# Patient Record
Sex: Male | Born: 1980 | Race: White | Hispanic: No | Marital: Married | Smoking: Never smoker
Health system: Southern US, Community
[De-identification: ages and names within clinical notes are randomized; demographics above are authoritative.]

## PROBLEM LIST (undated history)

## (undated) DIAGNOSIS — T148XXA Other injury of unspecified body region, initial encounter: Secondary | ICD-10-CM

## (undated) DIAGNOSIS — J45909 Unspecified asthma, uncomplicated: Secondary | ICD-10-CM

## (undated) DIAGNOSIS — S02109A Fracture of base of skull, unspecified side, initial encounter for closed fracture: Secondary | ICD-10-CM

## (undated) DIAGNOSIS — Z8669 Personal history of other diseases of the nervous system and sense organs: Secondary | ICD-10-CM

## (undated) HISTORY — DX: Other injury of unspecified body region, initial encounter: T14.8XXA

## (undated) HISTORY — DX: Unspecified asthma, uncomplicated: J45.909

## (undated) HISTORY — PX: MOUTH SURGERY: SHX715

## (undated) HISTORY — DX: Personal history of other diseases of the nervous system and sense organs: Z86.69

---
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
## 2007-02-17 ENCOUNTER — Ambulatory Visit: Payer: Self-pay | Admitting: Internal Medicine

## 2010-08-01 ENCOUNTER — Ambulatory Visit: Payer: Self-pay | Admitting: Orthopedic Surgery

## 2011-06-28 ENCOUNTER — Emergency Department (HOSPITAL_COMMUNITY)
Admission: EM | Admit: 2011-06-28 | Discharge: 2011-06-28 | Disposition: A | Discharge status: Home / Self Care | Payer: 59 | Attending: Emergency Medicine | Admitting: Emergency Medicine

## 2011-06-28 ENCOUNTER — Emergency Department (HOSPITAL_COMMUNITY): Payer: 59

## 2011-06-28 ENCOUNTER — Encounter (HOSPITAL_COMMUNITY): Payer: Self-pay | Admitting: Physical Medicine and Rehabilitation

## 2011-06-28 DIAGNOSIS — M79609 Pain in unspecified limb: Secondary | ICD-10-CM | POA: Insufficient documentation

## 2011-06-28 DIAGNOSIS — M542 Cervicalgia: Secondary | ICD-10-CM | POA: Insufficient documentation

## 2011-06-28 DIAGNOSIS — W19XXXA Unspecified fall, initial encounter: Secondary | ICD-10-CM

## 2011-06-28 DIAGNOSIS — M25579 Pain in unspecified ankle and joints of unspecified foot: Secondary | ICD-10-CM | POA: Insufficient documentation

## 2011-06-28 DIAGNOSIS — M79604 Pain in right leg: Secondary | ICD-10-CM

## 2011-06-28 DIAGNOSIS — R404 Transient alteration of awareness: Secondary | ICD-10-CM | POA: Insufficient documentation

## 2011-06-28 DIAGNOSIS — R109 Unspecified abdominal pain: Secondary | ICD-10-CM | POA: Insufficient documentation

## 2011-06-28 DIAGNOSIS — M549 Dorsalgia, unspecified: Secondary | ICD-10-CM | POA: Insufficient documentation

## 2011-06-28 HISTORY — DX: Fracture of base of skull, unspecified side, initial encounter for closed fracture: S02.109A

## 2011-06-28 MED ORDER — OXYCODONE HCL 5 MG PO TABS: 15.0000 mg | ORAL_TABLET | ORAL | Status: AC | PRN

## 2011-06-28 MED ORDER — OXYCODONE HCL 5 MG PO TABS
5.0000 mg | ORAL_TABLET | Freq: Once | ORAL | Status: AC
  Administered 2011-06-28: 5 mg via ORAL
  Filled 2011-06-28: qty 1

## 2011-06-28 NOTE — ED Notes (Signed)
Pt resting quietly at the time. Remains on cardiac monitor. States R sided rib cage and R leg "soreness." no signs of distress noted. Wife at bedside. Vital signs stable.

## 2011-06-28 NOTE — ED Notes (Signed)
Pt states 5/10 lower back pain at the time. Requesting pain medication. Vital signs stable. Family remains at bedside. No signs of distress noted.

## 2011-06-28 NOTE — ED Notes (Signed)
Pt discharged home, had no further questions. 
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
## 2011-06-28 NOTE — ED Notes (Addendum)
Pt ambulated around unit X 1 lap with no difficulties; pt reports being sore while walking but was able to walk independently; notified EDP

## 2015-02-25 DIAGNOSIS — Z8709 Personal history of other diseases of the respiratory system: Secondary | ICD-10-CM | POA: Insufficient documentation

## 2015-02-25 DIAGNOSIS — Z8669 Personal history of other diseases of the nervous system and sense organs: Secondary | ICD-10-CM | POA: Insufficient documentation

## 2015-02-26 ENCOUNTER — Encounter: Payer: Self-pay | Admitting: Family Medicine

## 2015-03-06 ENCOUNTER — Ambulatory Visit
Admission: RE | Admit: 2015-03-06 | Discharge: 2015-03-06 | Disposition: A | Discharge status: Home / Self Care | Payer: 59 | Source: Ambulatory Visit | Attending: Family Medicine | Admitting: Family Medicine

## 2015-03-06 ENCOUNTER — Encounter: Payer: Self-pay | Admitting: Family Medicine

## 2015-03-06 ENCOUNTER — Ambulatory Visit (INDEPENDENT_AMBULATORY_CARE_PROVIDER_SITE_OTHER): Payer: 59 | Admitting: Family Medicine

## 2015-03-06 VITALS — BP 120/70 | HR 77 | Temp 98.6°F | Resp 16 | Wt 140.0 lb

## 2015-03-06 DIAGNOSIS — Z0000 Encounter for general adult medical examination without abnormal findings: Secondary | ICD-10-CM

## 2015-03-06 DIAGNOSIS — Z23 Encounter for immunization: Secondary | ICD-10-CM

## 2015-03-06 DIAGNOSIS — M25531 Pain in right wrist: Secondary | ICD-10-CM | POA: Diagnosis not present
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
## 2015-03-07 ENCOUNTER — Telehealth: Payer: Self-pay | Admitting: *Deleted

## 2015-03-07 LAB — RENAL FUNCTION PANEL
ALBUMIN: 4.8 g/dL (ref 3.5–5.5)
BUN/Creatinine Ratio: 12 (ref 8–19)
BUN: 13 mg/dL (ref 6–20)
CHLORIDE: 95 mmol/L — AB (ref 97–106)
CO2: 27 mmol/L (ref 18–29)
Calcium: 9.9 mg/dL (ref 8.7–10.2)
Creatinine, Ser: 1.12 mg/dL (ref 0.76–1.27)
GFR calc Af Amer: 100 mL/min/{1.73_m2} (ref >59)
GFR, EST NON AFRICAN AMERICAN: 86 mL/min/{1.73_m2} (ref >59)
GLUCOSE: 75 mg/dL (ref 65–99)
PHOSPHORUS: 3.5 mg/dL (ref 2.5–4.5)
POTASSIUM: 4 mmol/L (ref 3.5–5.2)
Sodium: 140 mmol/L (ref 136–144)

## 2015-03-07 LAB — LIPID PANEL
CHOLESTEROL TOTAL: 98 mg/dL — AB (ref 100–199)
Chol/HDL Ratio: 2.5 ratio units (ref 0.0–5.0)
HDL: 39 mg/dL — AB (ref >39)
LDL Calculated: 47 mg/dL (ref 0–99)
Triglycerides: 60 mg/dL (ref 0–149)
VLDL Cholesterol Cal: 12 mg/dL (ref 5–40)

## 2015-03-07 MED ORDER — NABUMETONE 750 MG PO TABS: 750.0000 mg | ORAL_TABLET | Freq: Two times a day (BID) | ORAL | Status: DC

## 2015-03-07 NOTE — Telephone Encounter (Signed)
Rx sent in to pharmacy. Patient notified.

## 2015-03-07 NOTE — Telephone Encounter (Signed)
He can try nabumetone 750mg  two tablets daily for 14 days.

## 2015-03-07 NOTE — Telephone Encounter (Signed)
Patient stated that he has taken naproxen before and he said it gave him "bad dreams". patietn wanted to no it there is something else he can take instead?

## 2015-03-07 NOTE — Telephone Encounter (Signed)
-----   Message from Malva Limesonald E Fisher, MD sent at 03/06/2015  5:28 PM EDT ----- Xray wrist is normal. No sign of fracture. He likely has sprain and should start rx for naprosyn 500mg  twice a day for 14 days.

## 2015-10-04 ENCOUNTER — Ambulatory Visit (INDEPENDENT_AMBULATORY_CARE_PROVIDER_SITE_OTHER): Payer: 59 | Admitting: Family Medicine

## 2015-10-04 ENCOUNTER — Encounter: Payer: Self-pay | Admitting: Family Medicine

## 2015-10-04 ENCOUNTER — Other Ambulatory Visit: Payer: 59

## 2015-10-04 VITALS — BP 120/80 | HR 83 | Temp 98.1°F | Resp 16 | Wt 146.0 lb

## 2015-10-04 DIAGNOSIS — Z9852 Vasectomy status: Secondary | ICD-10-CM

## 2015-10-04 DIAGNOSIS — Z809 Family history of malignant neoplasm, unspecified: Secondary | ICD-10-CM

## 2015-10-04 NOTE — Progress Notes (Signed)
       Patient: Timothy Crosby Male    DOB: 1981-03-08   35 y.o.   MRN: [id number] Visit Date: 10/04/2015  Today's Provider: Mila Merryonald Fisher, MD   Chief Complaint  Patient presents with  . Referral   Subjective:    HPI  Patient wants to discuss having a genetic cancer screening. His states his grandfather had prostate and bladder cancer, and uncle developed bladder cancer in his 4140s. His father developed throat cancer in his 6950s, and is a non-smoker and a non-drinker. His mother developed breast cancer in her 30s.     Allergies  Allergen Reactions  . Divalproex Sodium Other (See Comments)    unknown  . Primaquine Other (See Comments)    unknown  . Primaquine Phosphate    Previous Medications   NABUMETONE (RELAFEN) 750 MG TABLET    Take 1 tablet (750 mg total) by mouth 2 (two) times daily.    Review of Systems  Constitutional: Negative for fever, chills and appetite change.  Respiratory: Negative for chest tightness, shortness of breath and wheezing.   Cardiovascular: Negative for chest pain and palpitations.  Gastrointestinal: Negative for nausea, vomiting and abdominal pain.    Social History  Substance Use Topics  . Smoking status: Never Smoker   . Smokeless tobacco: Former NeurosurgeonUser    Types: Chew    Quit date: 07/10/2012     Comment: previously chewed 1 can daily for 18 years  . Alcohol Use: No   Objective:   BP 120/80 mmHg  Pulse 83  Temp(Src) 98.1 F (36.7 C) (Oral)  Resp 16  Wt 146 lb (66.225 kg)  SpO2 95%  Physical Exam  n/a    Assessment & Plan:     1. Family history of cancer Counseled patient on importance of recommended cancer screenings. Genetic counseling is still somewhat limited. His wife wants him to have genetic screening. Recommend he setup evaluation with genetic counselor. Will get back in touch with patient regarding genetic counseling available in the area.        Mila Merryonald Fisher, MD  [facility]

## 2015-10-05 LAB — POST-VAS SPERM EVALUATION,QUAL
Post-Vas Sperm, Qual: ABSENT
SEMEN VOLUME: 3.6 mL

## 2015-10-06 ENCOUNTER — Telehealth: Payer: Self-pay | Admitting: Family Medicine

## 2015-10-06 NOTE — Telephone Encounter (Signed)
Please advise patient that he should contact [facility] for consultation regarding genetic cancer screening. No referral is needed. He can call [phone] or Google [facility].

## 2015-10-07 ENCOUNTER — Telehealth: Payer: Self-pay

## 2015-10-07 ENCOUNTER — Telehealth: Payer: Self-pay | Admitting: Urology

## 2015-10-07 NOTE — Telephone Encounter (Signed)
Advised patient as below.  

## 2015-10-07 NOTE — Telephone Encounter (Signed)
Spoke with pt in reference to vas sample. Pt voiced understanding. Vas was performed 06/2014.

## 2015-10-07 NOTE — Telephone Encounter (Signed)
-----   Message from Vanna ScotlandAshley Brandon, MD sent at 10/07/2015 10:57 AM EDT ----- No sperm.  Did we do this over a year ago?  I see no notes.  Vanna ScotlandAshley Brandon, MD

## 2015-10-07 NOTE — Telephone Encounter (Signed)
Pt was wondering the results of his semen sample the dropped off the other day. Please advise

## 2016-04-15 ENCOUNTER — Ambulatory Visit: Payer: 59

## 2016-04-15 ENCOUNTER — Ambulatory Visit (INDEPENDENT_AMBULATORY_CARE_PROVIDER_SITE_OTHER): Payer: 59 | Admitting: Family Medicine

## 2016-04-15 DIAGNOSIS — Z23 Encounter for immunization: Secondary | ICD-10-CM | POA: Diagnosis not present

## 2016-09-14 DIAGNOSIS — Z0000 Encounter for general adult medical examination without abnormal findings: Secondary | ICD-10-CM | POA: Diagnosis not present

## 2016-09-14 DIAGNOSIS — K219 Gastro-esophageal reflux disease without esophagitis: Secondary | ICD-10-CM | POA: Diagnosis not present

## 2016-09-14 DIAGNOSIS — K580 Irritable bowel syndrome with diarrhea: Secondary | ICD-10-CM | POA: Diagnosis not present

## 2016-09-14 DIAGNOSIS — F5101 Primary insomnia: Secondary | ICD-10-CM | POA: Diagnosis not present

## 2016-09-25 DIAGNOSIS — R7989 Other specified abnormal findings of blood chemistry: Secondary | ICD-10-CM | POA: Diagnosis not present

## 2017-03-08 ENCOUNTER — Ambulatory Visit (INDEPENDENT_AMBULATORY_CARE_PROVIDER_SITE_OTHER): Payer: 59 | Admitting: Family Medicine

## 2017-03-08 ENCOUNTER — Encounter: Payer: Self-pay | Admitting: *Deleted

## 2017-03-08 ENCOUNTER — Encounter: Payer: Self-pay | Admitting: Family Medicine

## 2017-03-08 VITALS — BP 104/68 | HR 71 | Temp 98.5°F | Resp 16 | Wt 151.0 lb

## 2017-03-08 DIAGNOSIS — S39012A Strain of muscle, fascia and tendon of lower back, initial encounter: Secondary | ICD-10-CM | POA: Diagnosis not present

## 2017-03-08 LAB — POCT URINALYSIS DIPSTICK
Bilirubin, UA: NEGATIVE
Blood, UA: NEGATIVE
Glucose, UA: NEGATIVE
KETONES UA: NEGATIVE
LEUKOCYTES UA: NEGATIVE
NITRITE UA: NEGATIVE
PROTEIN UA: NEGATIVE
Spec Grav, UA: <=1.005 — AB (ref 1.010–1.025)
UROBILINOGEN UA: 0.2 U/dL
pH, UA: 6.5 (ref 5.0–8.0)

## 2017-03-08 NOTE — Patient Instructions (Signed)
Low Back Sprain  A sprain is a stretch or tear in the bands of tissue that hold bones and joints together (ligaments). Sprains of the lower back (lumbar spine) are a common cause of low back pain. A sprain occurs when ligaments are overextended or stretched beyond their limits. The ligaments can become inflamed, resulting in pain and sudden muscle tightening (spasms). A sprain can be caused by an injury (trauma), or it can develop gradually due to overuse.  There are three types of sprains:  · Grade 1 is a mild sprain involving an overstretched ligament or a very slight tear of the ligament.  · Grade 2 is a moderate sprain involving a partial tear of the ligament.  · Grade 3 is a severe sprain involving a complete tear of the ligament.    What are the causes?  This condition may be caused by:  · Trauma, such as a fall or a hit to the body.  · Twisting or overstretching the back. This may result from doing activities that require a lot of energy, such as lifting heavy objects.    What increases the risk?  The following factors may increase your risk of getting this condition:  · Playing contact sports.  · Participating in sports or activities that put excessive stress on the back and require a lot of bending and twisting, including:  ? Lifting weights or heavy objects.  ? Gymnastics.  ? Soccer.  ? Figure skating.  ? Snowboarding.  · Being overweight or obese.  · Having poor strength and flexibility.    What are the signs or symptoms?  Symptoms of this condition may include:  · Sharp or dull pain in the lower back that does not go away. Pain may extend to the buttocks.  · Stiffness.  · Limited range of motion.  · Inability to stand up straight due to stiffness or pain.  · Muscle spasms.    How is this diagnosed?    This condition may be diagnosed based on:  · Your symptoms.  · Your medical history.  · A physical exam.  ? Your health care provider may push on certain areas of your back to determine the source of your  pain.  ? You may be asked to bend forward, backward, and side to side to assess the severity of your pain and your range of motion.  · Imaging tests, such as:  ? X-rays.  ? MRI.    How is this treated?  Treatment for this condition may include:  · Applying heat and cold to the affected area.  · Medicines to help relieve pain and to relax your muscles (muscle relaxants).  · NSAIDs to help reduce swelling and discomfort.  · Physical therapy.    When your symptoms improve, it is important to gradually return to your normal routine as soon as possible to reduce pain, avoid stiffness, and avoid loss of muscle strength. Generally, symptoms should improve within 6 weeks of treatment. However, recovery time varies.  Follow these instructions at home:  Managing pain, stiffness, and swelling  · If directed, apply ice to the injured area during the first 24 hours after your injury.  ? Put ice in a plastic bag.  ? Place a towel between your skin and the bag.  ? Leave the ice on for 20 minutes, 2-3 times a day.  · If directed, apply heat to the affected area as often as told by your health care provider. Use the   heat source that your health care provider recommends, such as a moist heat pack or a heating pad.  ? Place a towel between your skin and the heat source.  ? Leave the heat on for 20-30 minutes.  ? Remove the heat if your skin turns bright red. This is especially important if you are unable to feel pain, heat, or cold. You may have a greater risk of getting burned.  Activity  · Rest and return to your normal activities as told by your health care provider. Ask your health care provider what activities are safe for you.  · Avoid activities that take a lot of effort (are strenuous) for as long as told by your health care provider.  · Do exercises as told by your health care provider.  General instructions    · Take over-the-counter and prescription medicines only as told by your health care provider.  · If you have  questions or concerns about safety while taking pain medicine, talk with your health care provider.  · Do not drive or operate heavy machinery until you know how your pain medicine affects you.  · Do not use any tobacco products, such as cigarettes, chewing tobacco, and e-cigarettes. Tobacco can delay bone healing. If you need help quitting, ask your health care provider.  · Keep all follow-up visits as told by your health care provider. This is important.  How is this prevented?  · Warm up and stretch before being active.  · Cool down and stretch after being active.  · Give your body time to rest between periods of activity.  · Avoid:  ? Being physically inactive for long periods at a time.  ? Exercising or playing sports when you are tired or in pain.  · Use correct form when playing sports and lifting heavy objects.  · Use good posture when sitting and standing.  · Maintain a healthy weight.  · Sleep on a mattress with medium firmness to support your back.  · Make sure to use equipment that fits you, including shoes that fit well.  · Be safe and responsible while being active to avoid falls.  · Do at least 150 minutes of moderate-intensity exercise each week, such as brisk walking or water aerobics. Try a form of exercise that takes stress off your back, such as swimming or stationary cycling.  · Maintain physical fitness, including:  ? Strength. In particular, develop and maintain strong abdominal muscles.  ? Flexibility.  ? Cardiovascular fitness.  ? Endurance.  Contact a health care provider if:  · Your back pain does not improve after 6 weeks of treatment.  · Your symptoms get worse.  Get help right away if:  · Your back pain is severe.  · You are unable to stand or walk.  · You develop pain in your legs.  · You develop weakness in your buttocks or legs.  · You have difficulty controlling when you urinate or when you have a bowel movement.  This information is not intended to replace advice given to you by  your health care provider. Make sure you discuss any questions you have with your health care provider.  Document Released: 05/04/2005 Document Revised: 01/09/2016 Document Reviewed: 02/13/2015  Elsevier Interactive Patient Education © 2018 [employer]

## 2017-03-08 NOTE — Progress Notes (Signed)
Patient: Timothy Crosby Male    DOB: [birth year]   36 y.o.   MRN: [id number] Visit Date: 03/08/2017  Today's Provider: Mila Merry, MD   Chief Complaint  Patient presents with  . Back Pain   Subjective:    Back Pain  This is a new problem. Episode onset: 1 week ago. The problem has been gradually worsening since onset. Quality: sharp pain in mid-lower back. The symptoms are aggravated by bending (picking up a small child). Pertinent negatives include no abdominal pain, bladder incontinence, bowel incontinence, chest pain, dysuria, fever, headaches, leg pain, numbness, paresis, tingling, weakness or weight loss. He has tried NSAIDs, heat and ice for the symptoms. The treatment provided mild relief.  Patient states 1 week ago he has urinary hesitancy.    Yesterday was picking up his daughter. Took 800mg  ibuprofen last night and this morning with mild rrelief.  Unable to work today.     Allergies  Allergen Reactions  . Divalproex Sodium Other (See Comments)    unknown  . Primaquine Other (See Comments)    unknown  . Primaquine Phosphate      Current Outpatient Prescriptions:  .  ibuprofen (ADVIL,MOTRIN) 800 MG tablet, Take 800 mg by mouth every 8 (eight) hours as needed., Disp: , Rfl:   Review of Systems  Constitutional: Negative for fever and weight loss.  Cardiovascular: Negative for chest pain.  Gastrointestinal: Negative for abdominal pain and bowel incontinence.  Genitourinary: Negative for bladder incontinence and dysuria.       Urinary hesitancy  Musculoskeletal: Positive for back pain.  Neurological: Negative for tingling, weakness, numbness and headaches.    Social History  Substance Use Topics  . Smoking status: Never Smoker  . 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 Alcohol use No   Objective:   BP 104/68 (BP Location: Right Arm, Patient Position: Sitting, Cuff  Size: Normal)   Pulse 71   Temp 98.5 F (36.9 C) (Oral)   Resp 16   Wt 151 lb (68.5 kg)   SpO2 97% Comment: room  air Vitals:   03/08/17 1454  BP: 104/68  Pulse: 71  Resp: 16  Temp: 98.5 F (36.9 C)  TempSrc: Oral  SpO2: 97%  Weight: 151 lb (68.5 kg)     Physical Exam  General appearance: alert, well developed, well nourished, cooperative and in no distress Head: Normocephalic, without obvious abnormality, atraumatic Respiratory: Respirations even and unlabored, normal respiratory rate MS: moderate tenderness over upper lumbar spine. No swelling or gross deformities. No para lumbar muscle tenderness. +5 LE Ms strength.    Results for orders placed or performed in visit on 03/08/17  POCT Urinalysis Dipstick  Result Value Ref Range   Color, UA yellow    Clarity, UA clear    Glucose, UA negative    Bilirubin, UA negative    Ketones, UA negative    Spec Grav, UA <=1.005 (A) 1.010 - 1.025   Blood, UA negative    pH, UA 6.5 5.0 - 8.0   Protein, UA negative    Urobilinogen, UA 0.2 0.2 or 1.0 E.U./dL   Nitrite, UA negative    Leukocytes, UA Negative Negative       Assessment & Plan:     1.  Strain of lumbar region, initial encounter Continue applying ice 3-4 times a day  for next 24 hours, then switch to heat. Continue ibuprofen up to 800 mg three times a day.  Call if symptoms change or if not rapidly improving.  Work excuse yesterday, today, and tomorrow.   - POCT Urinalysis Dipstick       Mila Merryonald Fisher, MD  [facility]

## 2017-05-26 IMAGING — CR DG WRIST COMPLETE 3+V*R*
1 series · 4 of 4 positions shown · non-contrast
Comparison: None.

CLINICAL DATA: Right wrist pain, no known injury

EXAM:
RIGHT WRIST - COMPLETE 3+ VIEW

[Series 1: dg wrist complete right · 0.14mm/px · 4 of 4 slices shown]
[im 1/4]
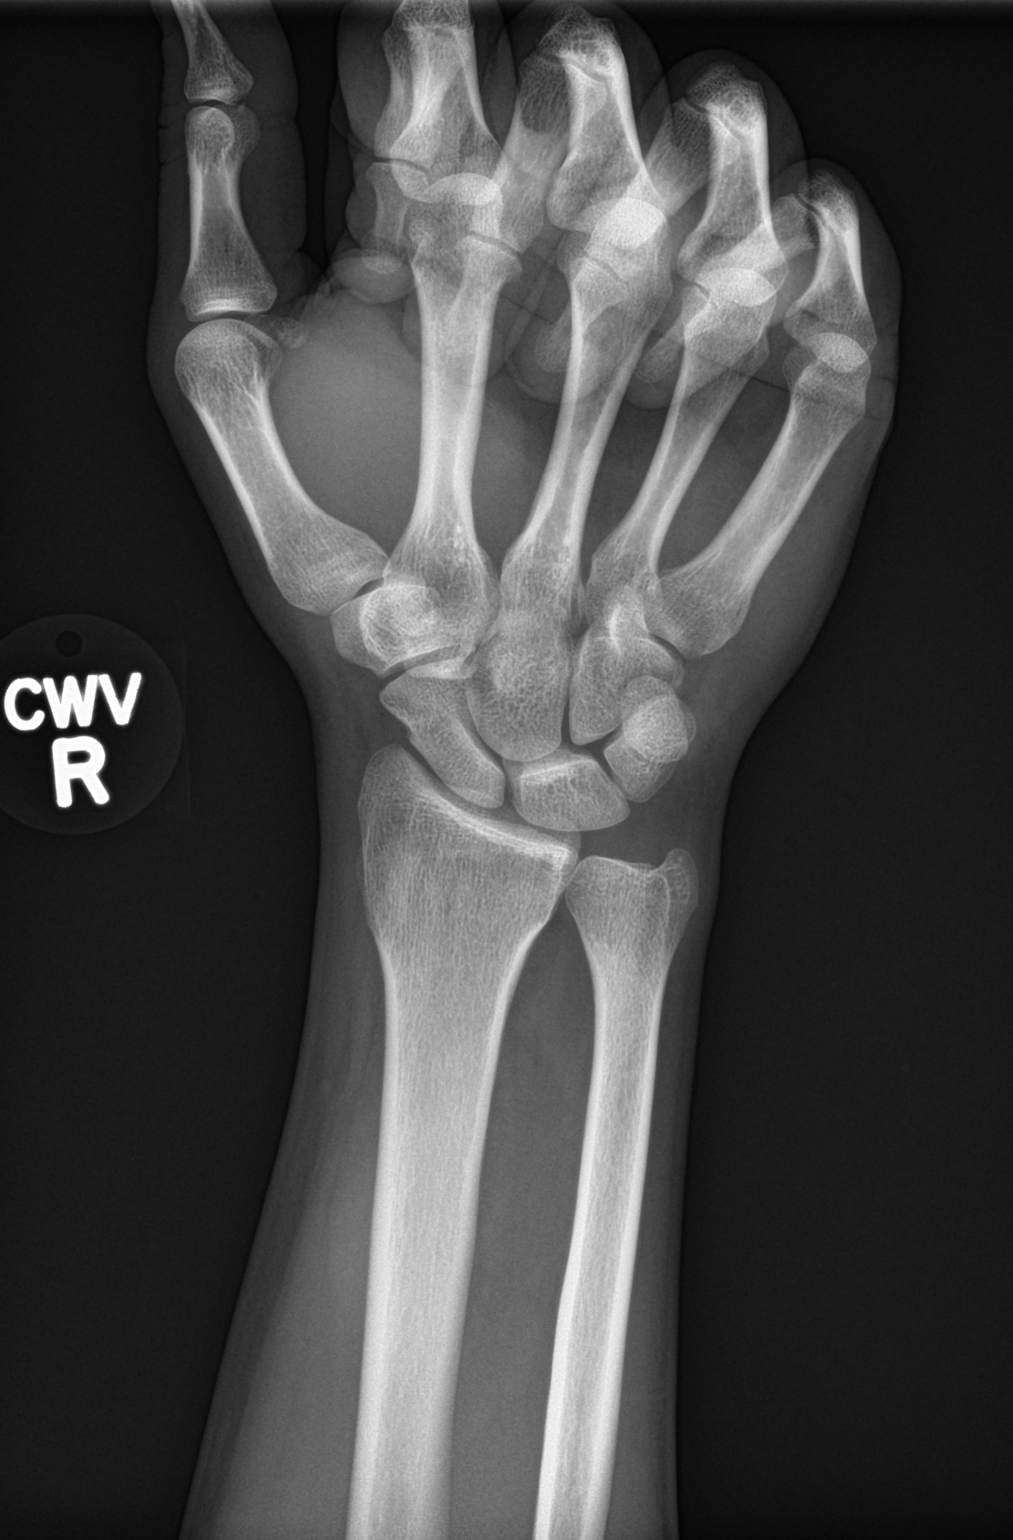
[im 2/4]
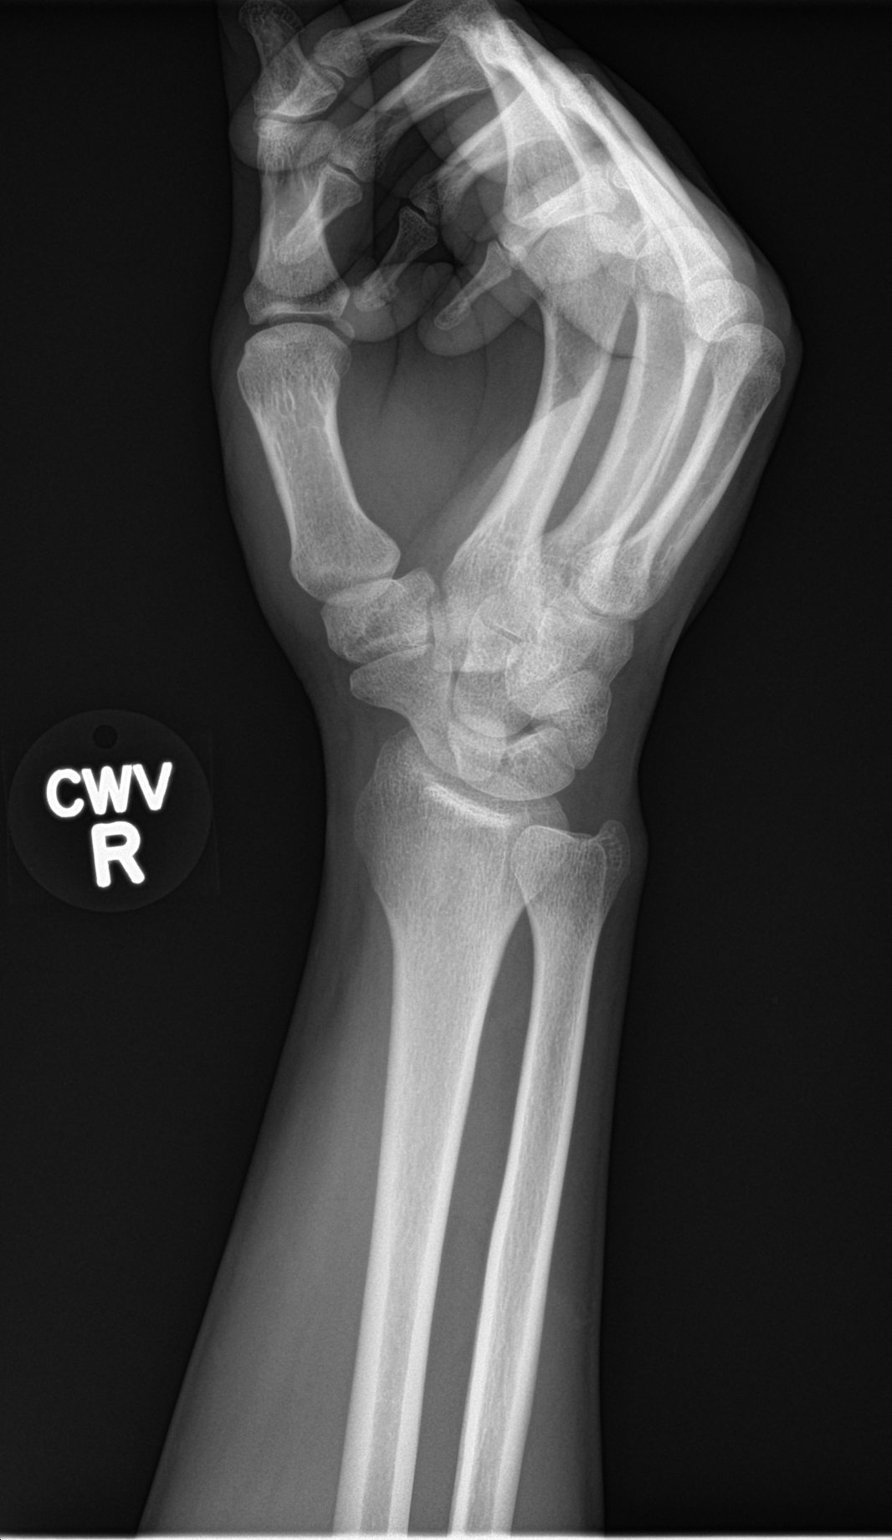
[im 3/4]
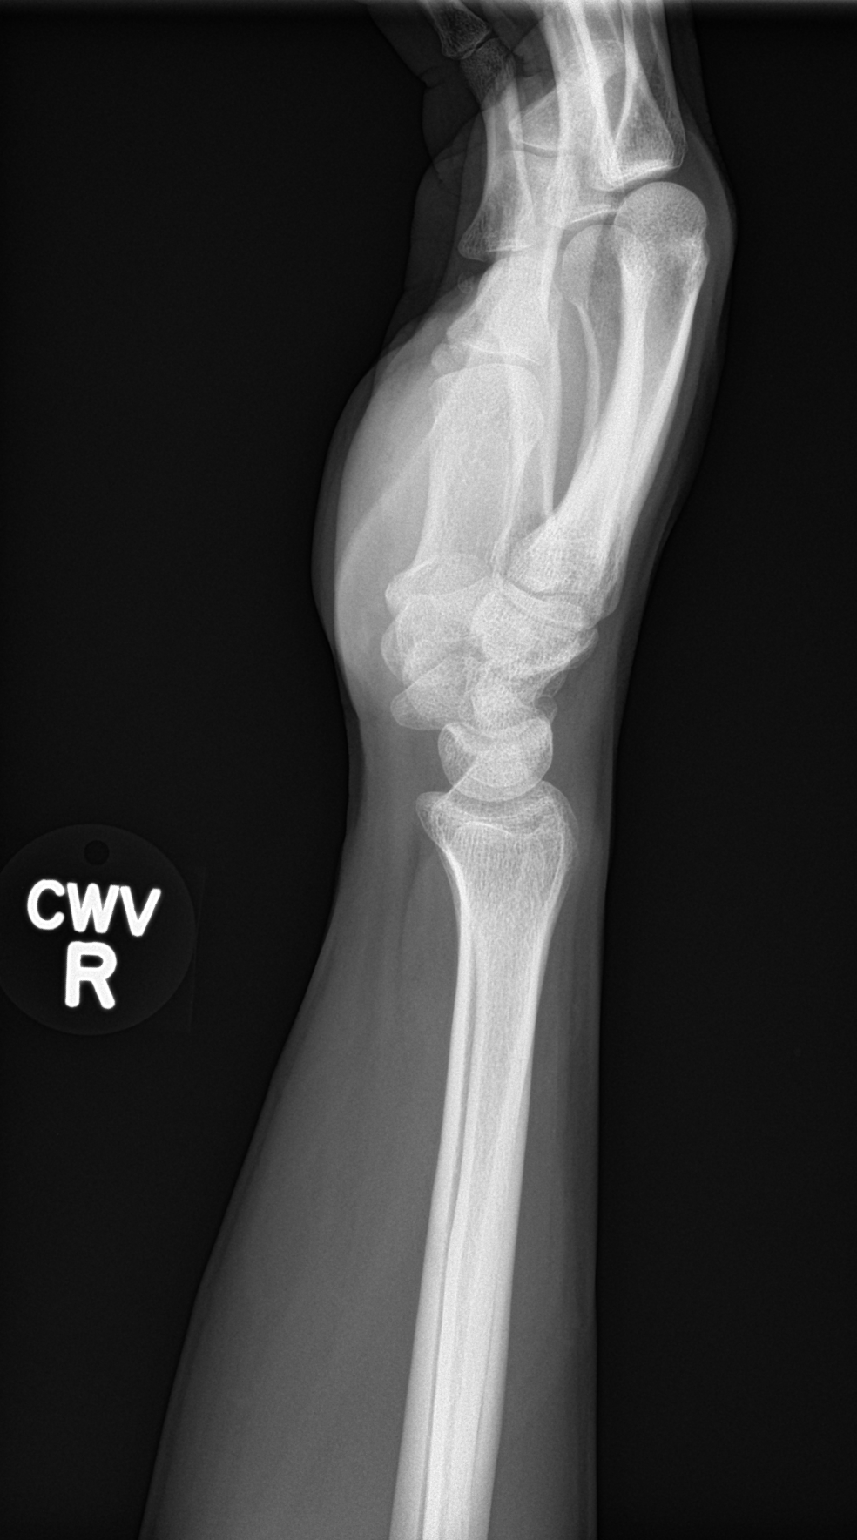
[im 4/4]
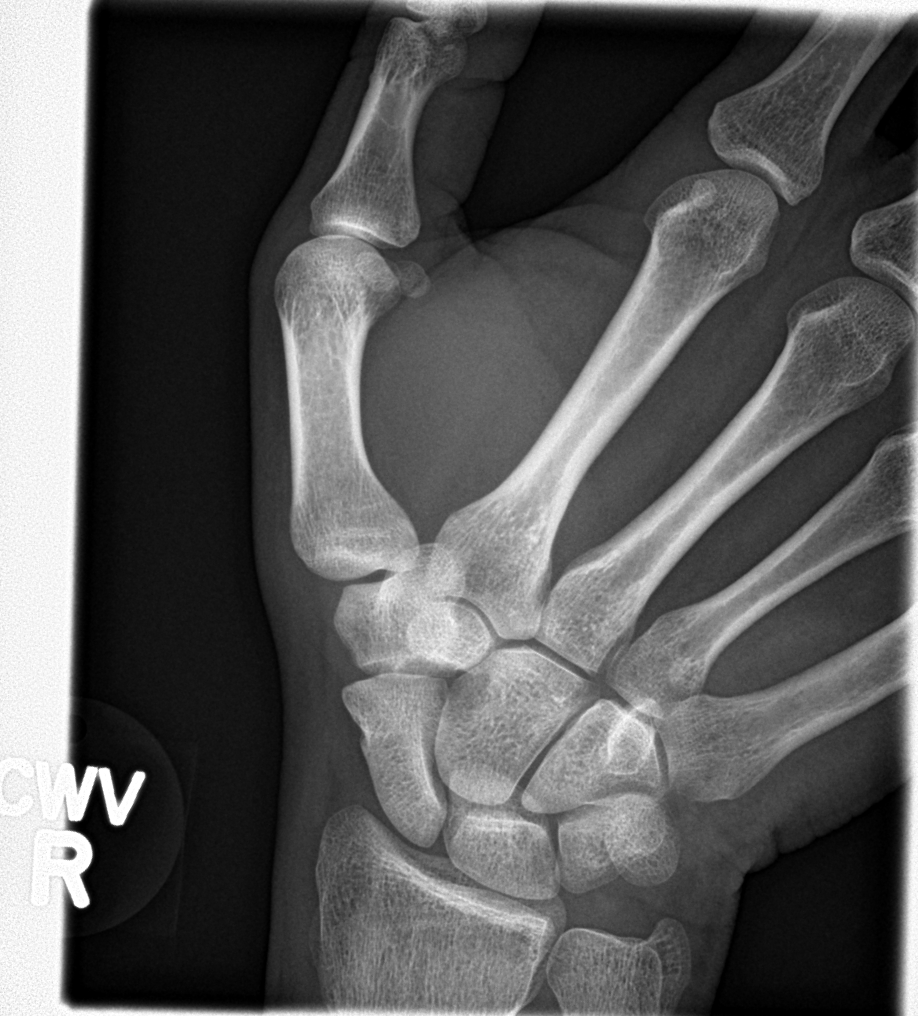

[4 of 4 positions shown; findings below may reference images not displayed]

FINDINGS: The right radiocarpal joint space appears normal. The ulnar styloid
is intact. The carpal bones are in normal position. Alignment is
normal. No fracture is seen. No soft tissue calcification is noted.
IMPRESSION: Negative.

## 2017-08-02 DIAGNOSIS — M9902 Segmental and somatic dysfunction of thoracic region: Secondary | ICD-10-CM | POA: Diagnosis not present

## 2017-08-02 DIAGNOSIS — M546 Pain in thoracic spine: Secondary | ICD-10-CM | POA: Diagnosis not present

## 2017-08-02 DIAGNOSIS — M9901 Segmental and somatic dysfunction of cervical region: Secondary | ICD-10-CM | POA: Diagnosis not present

## 2022-01-16 ENCOUNTER — Telehealth: Payer: Self-pay

## 2022-01-16 NOTE — Telephone Encounter (Signed)
Copied from CRM [id number]. Topic: Medical Record Request - Other >> Jan 16, 2022  3:36 PM De Blanch wrote: Reason for CRM: Pt stated he filed a claim with [employer]. Pt is asking if he needs to come into the office to sign release forms for medical records to be released when they make the request or if this can be emailed to him.  Please advise.

## 2022-02-02 ENCOUNTER — Encounter (HOSPITAL_COMMUNITY)
Admission: EM | Disposition: A | Discharge status: Home / Self Care | Payer: Self-pay | Source: Home / Self Care | Attending: Emergency Medicine

## 2022-02-02 ENCOUNTER — Emergency Department (HOSPITAL_COMMUNITY): Payer: 59

## 2022-02-02 ENCOUNTER — Encounter (HOSPITAL_COMMUNITY): Payer: Self-pay

## 2022-02-02 ENCOUNTER — Other Ambulatory Visit: Payer: Self-pay

## 2022-02-02 ENCOUNTER — Ambulatory Visit (HOSPITAL_COMMUNITY)
Admission: EM | Admit: 2022-02-02 | Discharge: 2022-02-02 | Disposition: A | Discharge status: Home / Self Care | Payer: 59 | Attending: Surgery | Admitting: Surgery

## 2022-02-02 ENCOUNTER — Emergency Department (HOSPITAL_BASED_OUTPATIENT_CLINIC_OR_DEPARTMENT_OTHER): Payer: 59 | Admitting: Anesthesiology

## 2022-02-02 ENCOUNTER — Emergency Department (HOSPITAL_COMMUNITY): Payer: 59 | Admitting: Anesthesiology

## 2022-02-02 DIAGNOSIS — K8012 Calculus of gallbladder with acute and chronic cholecystitis without obstruction: Secondary | ICD-10-CM | POA: Insufficient documentation

## 2022-02-02 DIAGNOSIS — K828 Other specified diseases of gallbladder: Secondary | ICD-10-CM | POA: Diagnosis not present

## 2022-02-02 DIAGNOSIS — R109 Unspecified abdominal pain: Secondary | ICD-10-CM | POA: Diagnosis present

## 2022-02-02 DIAGNOSIS — K8000 Calculus of gallbladder with acute cholecystitis without obstruction: Secondary | ICD-10-CM

## 2022-02-02 DIAGNOSIS — G40909 Epilepsy, unspecified, not intractable, without status epilepticus: Secondary | ICD-10-CM | POA: Diagnosis not present

## 2022-02-02 HISTORY — PX: CHOLECYSTECTOMY: SHX55

## 2022-02-02 LAB — LIPASE, BLOOD: Lipase: 42 U/L (ref 11–51)

## 2022-02-02 LAB — URINALYSIS, ROUTINE W REFLEX MICROSCOPIC
Bilirubin Urine: NEGATIVE
Glucose, UA: NEGATIVE mg/dL
Hgb urine dipstick: NEGATIVE
Ketones, ur: NEGATIVE mg/dL
Leukocytes,Ua: NEGATIVE
Nitrite: NEGATIVE
Protein, ur: NEGATIVE mg/dL
Specific Gravity, Urine: 1.03 (ref 1.005–1.030)
pH: 5 (ref 5.0–8.0)

## 2022-02-02 LAB — CBC WITH DIFFERENTIAL/PLATELET
Abs Immature Granulocytes: 0.03 10*3/uL (ref 0.00–0.07)
Basophils Absolute: 0 10*3/uL (ref 0.0–0.1)
Basophils Relative: 0 %
Eosinophils Absolute: 0 10*3/uL (ref 0.0–0.5)
Eosinophils Relative: 0 %
HCT: 43.9 % (ref 39.0–52.0)
Hemoglobin: 15.1 g/dL (ref 13.0–17.0)
Immature Granulocytes: 0 %
Lymphocytes Relative: 12 %
Lymphs Abs: 1.2 10*3/uL (ref 0.7–4.0)
MCH: 28.9 pg (ref 26.0–34.0)
MCHC: 34.4 g/dL (ref 30.0–36.0)
MCV: 84.1 fL (ref 80.0–100.0)
Monocytes Absolute: 0.5 10*3/uL (ref 0.1–1.0)
Monocytes Relative: 5 %
Neutro Abs: 8.1 10*3/uL — ABNORMAL HIGH (ref 1.7–7.7)
Neutrophils Relative %: 83 %
Platelets: 273 10*3/uL (ref 150–400)
RBC: 5.22 MIL/uL (ref 4.22–5.81)
RDW: 12 % (ref 11.5–15.5)
WBC: 9.8 10*3/uL (ref 4.0–10.5)
nRBC: 0 % (ref 0.0–0.2)

## 2022-02-02 LAB — COMPREHENSIVE METABOLIC PANEL
ALT: 18 U/L (ref 0–44)
AST: 21 U/L (ref 15–41)
Albumin: 4.6 g/dL (ref 3.5–5.0)
Alkaline Phosphatase: 56 U/L (ref 38–126)
Anion gap: 7 (ref 5–15)
BUN: 13 mg/dL (ref 6–20)
CO2: 28 mmol/L (ref 22–32)
Calcium: 9.7 mg/dL (ref 8.9–10.3)
Chloride: 105 mmol/L (ref 98–111)
Creatinine, Ser: 1.32 mg/dL — ABNORMAL HIGH (ref 0.61–1.24)
GFR, Estimated: >60 mL/min (ref >60)
Glucose, Bld: 123 mg/dL — ABNORMAL HIGH (ref 70–99)
Potassium: 4.1 mmol/L (ref 3.5–5.1)
Sodium: 140 mmol/L (ref 135–145)
Total Bilirubin: 0.7 mg/dL (ref 0.3–1.2)
Total Protein: 7.1 g/dL (ref 6.5–8.1)

## 2022-02-02 SURGERY — LAPAROSCOPIC CHOLECYSTECTOMY
Anesthesia: General | Site: Abdomen

## 2022-02-02 MED ORDER — BUPIVACAINE-EPINEPHRINE (PF) 0.25% -1:200000 IJ SOLN
INTRAMUSCULAR | Status: AC
  Filled 2022-02-02: qty 30

## 2022-02-02 MED ORDER — FENTANYL CITRATE (PF) 250 MCG/5ML IJ SOLN
INTRAMUSCULAR | Status: AC
  Filled 2022-02-02: qty 5

## 2022-02-02 MED ORDER — PROPOFOL 10 MG/ML IV BOLUS
INTRAVENOUS | Status: DC | PRN
  Administered 2022-02-02: 120 mg via INTRAVENOUS

## 2022-02-02 MED ORDER — OXYCODONE HCL 5 MG PO TABS
5.0000 mg | ORAL_TABLET | Freq: Once | ORAL | Status: AC
  Administered 2022-02-02: 5 mg via ORAL

## 2022-02-02 MED ORDER — DEXAMETHASONE SODIUM PHOSPHATE 10 MG/ML IJ SOLN
INTRAMUSCULAR | Status: DC | PRN
  Administered 2022-02-02: 8 mg via INTRAVENOUS

## 2022-02-02 MED ORDER — LACTATED RINGERS IV SOLN: INTRAVENOUS | Status: DC

## 2022-02-02 MED ORDER — BUPIVACAINE-EPINEPHRINE 0.25% -1:200000 IJ SOLN
INTRAMUSCULAR | Status: DC | PRN
  Administered 2022-02-02: 20 mL

## 2022-02-02 MED ORDER — 0.9 % SODIUM CHLORIDE (POUR BTL) OPTIME
TOPICAL | Status: DC | PRN
  Administered 2022-02-02: 1000 mL

## 2022-02-02 MED ORDER — SODIUM CHLORIDE 0.9 % IV SOLN
2.0000 g | Freq: Once | INTRAVENOUS | Status: AC
  Administered 2022-02-02: 2 g via INTRAVENOUS
  Filled 2022-02-02: qty 20

## 2022-02-02 MED ORDER — SODIUM CHLORIDE 0.9 % IR SOLN
Status: DC | PRN
  Administered 2022-02-02: 1000 mL

## 2022-02-02 MED ORDER — IOHEXOL 9 MG/ML PO SOLN
ORAL | Status: AC
  Filled 2022-02-02: qty 1000

## 2022-02-02 MED ORDER — SUGAMMADEX SODIUM 200 MG/2ML IV SOLN
INTRAVENOUS | Status: DC | PRN
  Administered 2022-02-02: 200 mg via INTRAVENOUS

## 2022-02-02 MED ORDER — LIDOCAINE 2% (20 MG/ML) 5 ML SYRINGE
INTRAMUSCULAR | Status: DC | PRN
  Administered 2022-02-02: 40 mg via INTRAVENOUS

## 2022-02-02 MED ORDER — MIDAZOLAM HCL 2 MG/2ML IJ SOLN
INTRAMUSCULAR | Status: DC | PRN
  Administered 2022-02-02: 2 mg via INTRAVENOUS

## 2022-02-02 MED ORDER — MIDAZOLAM HCL 2 MG/2ML IJ SOLN
INTRAMUSCULAR | Status: AC
  Filled 2022-02-02: qty 2

## 2022-02-02 MED ORDER — ORAL CARE MOUTH RINSE: 15.0000 mL | Freq: Once | OROMUCOSAL | Status: AC

## 2022-02-02 MED ORDER — PROPOFOL 10 MG/ML IV BOLUS
INTRAVENOUS | Status: AC
  Filled 2022-02-02: qty 20

## 2022-02-02 MED ORDER — FENTANYL CITRATE (PF) 100 MCG/2ML IJ SOLN
INTRAMUSCULAR | Status: AC
  Filled 2022-02-02: qty 2

## 2022-02-02 MED ORDER — DICYCLOMINE HCL 10 MG/ML IM SOLN: 20.0000 mg | Freq: Once | INTRAMUSCULAR | Status: DC

## 2022-02-02 MED ORDER — ONDANSETRON HCL 4 MG/2ML IJ SOLN
INTRAMUSCULAR | Status: DC | PRN
  Administered 2022-02-02: 4 mg via INTRAVENOUS

## 2022-02-02 MED ORDER — ACETAMINOPHEN 500 MG PO TABS
1000.0000 mg | ORAL_TABLET | Freq: Once | ORAL | Status: AC
  Administered 2022-02-02: 1000 mg via ORAL
  Filled 2022-02-02: qty 2

## 2022-02-02 MED ORDER — OXYCODONE HCL 5 MG PO TABS: 5.0000 mg | ORAL_TABLET | Freq: Four times a day (QID) | ORAL | 0 refills | Status: DC | PRN

## 2022-02-02 MED ORDER — CHLORHEXIDINE GLUCONATE 0.12 % MT SOLN: 15.0000 mL | Freq: Once | OROMUCOSAL | Status: AC

## 2022-02-02 MED ORDER — IBUPROFEN 200 MG PO TABS: 600.0000 mg | ORAL_TABLET | Freq: Three times a day (TID) | ORAL | 0 refills | Status: AC | PRN

## 2022-02-02 MED ORDER — SODIUM CHLORIDE 0.9 % IV BOLUS
1000.0000 mL | Freq: Once | INTRAVENOUS | Status: AC
  Administered 2022-02-02: 1000 mL via INTRAVENOUS

## 2022-02-02 MED ORDER — AMISULPRIDE (ANTIEMETIC) 5 MG/2ML IV SOLN: 10.0000 mg | Freq: Once | INTRAVENOUS | Status: DC | PRN

## 2022-02-02 MED ORDER — HYDROCODONE-ACETAMINOPHEN 5-325 MG PO TABS
1.0000 | ORAL_TABLET | Freq: Once | ORAL | Status: AC
  Administered 2022-02-02: 1 via ORAL
  Filled 2022-02-02: qty 1

## 2022-02-02 MED ORDER — FENTANYL CITRATE (PF) 250 MCG/5ML IJ SOLN
INTRAMUSCULAR | Status: DC | PRN
  Administered 2022-02-02 (×4): 50 ug via INTRAVENOUS

## 2022-02-02 MED ORDER — IOHEXOL 350 MG/ML SOLN
75.0000 mL | Freq: Once | INTRAVENOUS | Status: AC | PRN
  Administered 2022-02-02: 75 mL via INTRAVENOUS

## 2022-02-02 MED ORDER — OXYCODONE HCL 5 MG PO TABS
ORAL_TABLET | ORAL | Status: AC
  Filled 2022-02-02: qty 1

## 2022-02-02 MED ORDER — ROCURONIUM BROMIDE 10 MG/ML (PF) SYRINGE
PREFILLED_SYRINGE | INTRAVENOUS | Status: DC | PRN
  Administered 2022-02-02: 40 mg via INTRAVENOUS
  Administered 2022-02-02: 10 mg via INTRAVENOUS

## 2022-02-02 MED ORDER — FENTANYL CITRATE (PF) 100 MCG/2ML IJ SOLN
25.0000 ug | INTRAMUSCULAR | Status: DC | PRN
  Administered 2022-02-02: 50 ug via INTRAVENOUS

## 2022-02-02 MED ORDER — KETOROLAC TROMETHAMINE 30 MG/ML IJ SOLN
INTRAMUSCULAR | Status: DC | PRN
  Administered 2022-02-02: 30 mg via INTRAVENOUS

## 2022-02-02 MED ORDER — CHLORHEXIDINE GLUCONATE 0.12 % MT SOLN
OROMUCOSAL | Status: AC
  Administered 2022-02-02: 15 mL via OROMUCOSAL
  Filled 2022-02-02: qty 15

## 2022-02-02 SURGICAL SUPPLY — 36 items
ADH SKN CLS APL DERMABOND .7 (GAUZE/BANDAGES/DRESSINGS) ×1
APL PRP STRL LF DISP 70% ISPRP (MISCELLANEOUS) ×1
APPLIER CLIP 5 13 M/L LIGAMAX5 (MISCELLANEOUS) ×1
APR CLP MED LRG 5 ANG JAW (MISCELLANEOUS) ×1
BAG COUNTER SPONGE SURGICOUNT (BAG) ×2 IMPLANT
BAG SPEC RTRVL LRG 6X4 10 (ENDOMECHANICALS) ×1
BAG SPNG CNTER NS LX DISP (BAG) ×1
CANISTER SUCT 3000ML PPV (MISCELLANEOUS) ×2 IMPLANT
CHLORAPREP W/TINT 26 (MISCELLANEOUS) ×2 IMPLANT
CLIP APPLIE 5 13 M/L LIGAMAX5 (MISCELLANEOUS) ×2 IMPLANT
COVER SURGICAL LIGHT HANDLE (MISCELLANEOUS) ×2 IMPLANT
DERMABOND ADVANCED .7 DNX12 (GAUZE/BANDAGES/DRESSINGS) ×2 IMPLANT
ELECT REM PT RETURN 9FT ADLT (ELECTROSURGICAL) ×1
ELECTRODE REM PT RTRN 9FT ADLT (ELECTROSURGICAL) ×2 IMPLANT
GLOVE SURG SIGNA 7.5 PF LTX (GLOVE) ×2 IMPLANT
GOWN STRL REUS W/ TWL LRG LVL3 (GOWN DISPOSABLE) ×4 IMPLANT
GOWN STRL REUS W/ TWL XL LVL3 (GOWN DISPOSABLE) ×2 IMPLANT
GOWN STRL REUS W/TWL LRG LVL3 (GOWN DISPOSABLE) ×2
GOWN STRL REUS W/TWL XL LVL3 (GOWN DISPOSABLE) ×1
KIT BASIN OR (CUSTOM PROCEDURE TRAY) ×2 IMPLANT
KIT TURNOVER KIT B (KITS) ×2 IMPLANT
NS IRRIG 1000ML POUR BTL (IV SOLUTION) ×2 IMPLANT
PAD ARMBOARD 7.5X6 YLW CONV (MISCELLANEOUS) ×2 IMPLANT
POUCH SPECIMEN RETRIEVAL 10MM (ENDOMECHANICALS) ×2 IMPLANT
SCISSORS LAP 5X35 DISP (ENDOMECHANICALS) ×2 IMPLANT
SET IRRIG TUBING LAPAROSCOPIC (IRRIGATION / IRRIGATOR) ×2 IMPLANT
SET TUBE SMOKE EVAC HIGH FLOW (TUBING) ×2 IMPLANT
SLEEVE ENDOPATH XCEL 5M (ENDOMECHANICALS) ×4 IMPLANT
SPECIMEN JAR SMALL (MISCELLANEOUS) ×2 IMPLANT
SUT MNCRL AB 4-0 PS2 18 (SUTURE) ×2 IMPLANT
TOWEL GREEN STERILE (TOWEL DISPOSABLE) ×2 IMPLANT
TOWEL GREEN STERILE FF (TOWEL DISPOSABLE) ×2 IMPLANT
TRAY LAPAROSCOPIC MC (CUSTOM PROCEDURE TRAY) ×2 IMPLANT
TROCAR XCEL BLUNT TIP 100MML (ENDOMECHANICALS) ×2 IMPLANT
TROCAR Z-THREAD OPTICAL 5X100M (TROCAR) ×2 IMPLANT
WATER STERILE IRR 1000ML POUR (IV SOLUTION) ×2 IMPLANT

## 2022-02-02 NOTE — Anesthesia Preprocedure Evaluation (Signed)
Anesthesia Evaluation  Patient identified by MRN, date of birth, ID band Patient awake    Reviewed: Allergy & Precautions, NPO status , Patient's Chart, lab work & pertinent test results  Airway Mallampati: II  TM Distance: >3 FB Neck ROM: Full    Dental  (+) Dental Advisory Given   Pulmonary asthma ,    breath sounds clear to auscultation       Cardiovascular negative cardio ROS   Rhythm:Regular Rate:Normal     Neuro/Psych negative neurological ROS     GI/Hepatic negative GI ROS, Neg liver ROS,   Endo/Other  negative endocrine ROS  Renal/GU Renal InsufficiencyRenal disease     Musculoskeletal   Abdominal   Peds  Hematology negative hematology ROS (+)   Anesthesia Other Findings   Reproductive/Obstetrics                             Lab Results  Component Value Date   WBC 9.8 02/02/2022   HGB 15.1 02/02/2022   HCT 43.9 02/02/2022   MCV 84.1 02/02/2022   PLT 273 02/02/2022   Lab Results  Component Value Date   CREATININE 1.32 (H) 02/02/2022   BUN 13 02/02/2022   NA 140 02/02/2022   K 4.1 02/02/2022   CL 105 02/02/2022   CO2 28 02/02/2022    Anesthesia Physical Anesthesia Plan  ASA: 2  Anesthesia Plan: General   Post-op Pain Management: Tylenol PO (pre-op)* and Minimal or no pain anticipated   Induction: Intravenous  PONV Risk Score and Plan: 2 and Dexamethasone, Ondansetron and Treatment may vary due to age or medical condition  Airway Management Planned: Oral ETT  Additional Equipment:   Intra-op Plan:   Post-operative Plan: Extubation in OR  Informed Consent: I have reviewed the patients History and Physical, chart, labs and discussed the procedure including the risks, benefits and alternatives for the proposed anesthesia with the patient or authorized representative who has indicated his/her understanding and acceptance.     Dental advisory given  Plan  Discussed with: CRNA  Anesthesia Plan Comments:         Anesthesia Quick Evaluation

## 2022-02-02 NOTE — ED Notes (Addendum)
Consent signed, Report given to OR.

## 2022-02-02 NOTE — ED Provider Notes (Signed)
Accepted handoff at shift change from [facility]. Please see prior provider note for more detail.   Briefly: Patient is 41 y.o.   DDX: concern for abdominal pain focally in the epigastric region, radiating to right side, right upper quadrant, started yesterday shortly with [place] was very sharp at 10 PM last night associated with nausea, lack of appetite, pain never fully resolves but has waxing and waning periods of more significant pain.  Denies fever, chills, diarrhea.  Patient receiving CT with oral contrast.  I independently interpreted imaging including CT abdomen pelvis with contrast, right upper quadrant ultrasound which shows CT shows questionable gallbladder thickening, ultrasound shows cholelithiasis with gallbladder thickening, concerning for cholecystitis, positive sonographic Murphy sign. I agree with the radiologist interpretation.   Plan: Patient has been somewhat reticent to accept IV pain medication due to previous poor reactions, After discussion with patient agreed on trial of hydrocodone for additional pain control, will continue to hold off on IV Toradol as patient may end up having surgical cholecystitis.  Has ultrasound does show signs of early cholecystitis we will administer Rocephin, and consult surgery, spoke with PA for surgery who agrees to see this patient and will give recommendations based on her findings.   Plan for the OR today with possible discharge from PACU per Dr. Ninfa Linden.  Patient understands and agrees to this plan, he remains under reasonable pain control with occasional severe pains from his gallbladder dysfunction.   RISR  EDTHIS    Anselmo Pickler, PA-C 02/02/22 1253    Kneller, Reynolds K, DO 02/02/22 1315

## 2022-02-02 NOTE — Anesthesia Procedure Notes (Signed)
Procedure Name: Intubation Date/Time: 02/02/2022 3:09 PM  Performed by: Sammie Bench, CRNAPre-anesthesia Checklist: Patient identified, Emergency Drugs available, Suction available and Patient being monitored Patient Re-evaluated:Patient Re-evaluated prior to induction Oxygen Delivery Method: Circle System Utilized Preoxygenation: Pre-oxygenation with 100% oxygen Induction Type: IV induction Ventilation: Mask ventilation without difficulty Laryngoscope Size: Mac and 4 Grade View: Grade II Tube type: Oral Tube size: 8.0 mm Number of attempts: 2 Airway Equipment and Method: Stylet and Oral airway Placement Confirmation: ETT inserted through vocal cords under direct vision, positive ETCO2 and breath sounds checked- equal and bilateral Secured at: 23 cm Tube secured with: Tape Dental Injury: Teeth and Oropharynx as per pre-operative assessment  Comments: Firs attempt per Rexene Alberts, 2nd attempt per [facility] CRNA. + ETCO2, +BBS placed on vent

## 2022-02-02 NOTE — ED Triage Notes (Signed)
Pt began having10/10 RLQ pain that goes into RUQ. Pain started at 2200 and woke pt up out of sleep. Had emesis x2. Endorses diarrhea but that is his baseline.no fever or chills

## 2022-02-02 NOTE — ED Notes (Signed)
US at bedside

## 2022-02-02 NOTE — ED Notes (Signed)
Patient transported to CT 

## 2022-02-02 NOTE — Discharge Instructions (Signed)
[facility] ° °Please arrive at least 30 min before your appointment to complete your check in paperwork.  If you are unable to arrive 30 min prior to your appointment time we may have to cancel or reschedule you. °LAPAROSCOPIC SURGERY: POST OP INSTRUCTIONS °Always review your discharge instruction sheet given to you by the facility where your surgery was performed. °IF YOU HAVE DISABILITY OR FAMILY LEAVE FORMS, YOU MUST BRING THEM TO THE OFFICE FOR PROCESSING.   °DO NOT GIVE THEM TO YOUR DOCTOR. ° °PAIN CONTROL ° °First take acetaminophen (Tylenol) AND/or ibuprofen (Advil) to control your pain after surgery.  Follow directions on package.  Taking acetaminophen (Tylenol) and/or ibuprofen (Advil) regularly after surgery will help to control your pain and lower the amount of prescription pain medication you may need.  You should not take more than 4,000 mg (4 grams) of acetaminophen (Tylenol) in 24 hours.  You should not take ibuprofen (Advil), aleve, motrin, naprosyn or other NSAIDS if you have a history of stomach ulcers or chronic kidney disease.  °A prescription for pain medication may be given to you upon discharge.  Take your pain medication as prescribed, if you still have uncontrolled pain after taking acetaminophen (Tylenol) or ibuprofen (Advil). °Use ice packs to help control pain. °If you need a refill on your pain medication, please contact your pharmacy.  They will contact our office to request authorization. Prescriptions will not be filled after 5pm or on week-ends. ° °HOME MEDICATIONS °Take your usually prescribed medications unless otherwise directed. ° °DIET °You should follow a light diet the first few days after arrival home.  Be sure to include lots of fluids daily. Avoid fatty, fried foods.  ° °CONSTIPATION °It is common to experience some constipation after surgery and if you are taking pain medication.  Increasing fluid intake and taking a stool softener (such as Colace)  will usually help or prevent this problem from occurring.  A mild laxative (Milk of Magnesia or Miralax) should be taken according to package instructions if there are no bowel movements after 48 hours. ° °WOUND/INCISION CARE °Most patients will experience some swelling and bruising in the area of the incisions.  Ice packs will help.  Swelling and bruising can take several days to resolve.  °Unless discharge instructions indicate otherwise, follow guidelines below  °STERI-STRIPS - you may remove your outer bandages 48 hours after surgery, and you may shower at that time.  You have steri-strips (small skin tapes) in place directly over the incision.  These strips should be left on the skin for 7-10 days.   °DERMABOND/SKIN GLUE - you may shower in 24 hours.  The glue will flake off over the next 2-3 weeks. °Any sutures or staples will be removed at the office during your follow-up visit. ° °ACTIVITIES °You may resume regular (light) daily activities beginning the next day--such as daily self-care, walking, climbing stairs--gradually increasing activities as tolerated.  You may have sexual intercourse when it is comfortable.  Refrain from any heavy lifting or straining until approved by your doctor. °You may drive when you are no longer taking prescription pain medication, you can comfortably wear a seatbelt, and you can safely maneuver your car and apply brakes. ° °FOLLOW-UP °You should see your doctor in the office for a follow-up appointment approximately 2-3 weeks after your surgery.  You should have been given your post-op/follow-up appointment when your surgery was scheduled.  If you did not receive a post-op/follow-up appointment, make sure   that you call for this appointment within a day or two after you arrive home to insure a convenient appointment time. ° ° °WHEN TO CALL YOUR DOCTOR: °Fever over 101.0 °Inability to urinate °Continued bleeding from incision. °Increased pain, redness, or drainage from the  incision. °Increasing abdominal pain ° °The clinic staff is available to answer your questions during regular business hours.  Please don’t hesitate to call and ask to speak to one of the nurses for clinical concerns.  If you have a medical emergency, go to the nearest emergency room or call 911.  A surgeon from [facility] is always on call at the hospital. °[address], [address], [place], [place]  [address] ? [address] [address], [place], [place]   [address] °[phone] ? [phone] ? FAX [phone] ° ° ° ° °Managing Your Pain After Surgery Without Opioids ° ° ° °Thank you for participating in our program to help patients manage their pain after surgery without opioids. This is part of our effort to provide you with the best care possible, without exposing you or your family to the risk that opioids pose. ° °What pain can I expect after surgery? °You can expect to have some pain after surgery. This is normal. The pain is typically worse the day after surgery, and quickly begins to get better. °Many studies have found that many patients are able to manage their pain after surgery with Over-the-Counter (OTC) medications such as Tylenol and Motrin. If you have a condition that does not allow you to take Tylenol or Motrin, notify your surgical team. ° °How will I manage my pain? °The best strategy for controlling your pain after surgery is around the clock pain control with Tylenol (acetaminophen) and Motrin (ibuprofen or Advil). Alternating these medications with each other allows you to maximize your pain control. In addition to Tylenol and Motrin, you can use heating pads or ice packs on your incisions to help reduce your pain. ° °How will I alternate your regular strength over-the-counter pain medication? °You will take a dose of pain medication every three hours. °Start by taking 650 mg of Tylenol (2 pills of 325 mg) °3 hours later take 600 mg of Motrin (3 pills of 200 mg) °3 hours after  taking the Motrin take 650 mg of Tylenol °3 hours after that take 600 mg of Motrin. ° ° °- 1 - ° °See example - if your first dose of Tylenol is at 12:00 PM ° ° °12:00 PM Tylenol 650 mg (2 pills of 325 mg)  °3:00 PM Motrin 600 mg (3 pills of 200 mg)  °6:00 PM Tylenol 650 mg (2 pills of 325 mg)  °9:00 PM Motrin 600 mg (3 pills of 200 mg)  °Continue alternating every 3 hours  ° °We recommend that you follow this schedule around-the-clock for at least 3 days after surgery, or until you feel that it is no longer needed. Use the table on the last page of this handout to keep track of the medications you are taking. °Important: °Do not take more than 3000mg of Tylenol or 3200mg of Motrin in a 24-hour period. °Do not take ibuprofen/Motrin if you have a history of bleeding stomach ulcers, severe kidney disease, &/or actively taking a blood thinner ° °What if I still have pain? °If you have pain that is not controlled with the over-the-counter pain medications (Tylenol and Motrin or Advil) you might have what we call “breakthrough” pain. You will receive a prescription   for a small amount of an opioid pain medication such as Oxycodone, Tramadol, or Tylenol with Codeine. Use these opioid pills in the first 24 hours after surgery if you have breakthrough pain. Do not take more than 1 pill every 4-6 hours. ° °If you still have uncontrolled pain after using all opioid pills, don't hesitate to call our staff using the number provided. We will help make sure you are managing your pain in the best way possible, and if necessary, we can provide a prescription for additional pain medication. ° ° °Day 1   ° °Time  °Name of Medication Number of pills taken  °Amount of Acetaminophen  °Pain Level  ° °Comments  °AM PM       °AM PM       °AM PM       °AM PM       °AM PM       °AM PM       °AM PM       °AM PM       °Total Daily amount of Acetaminophen °Do not take more than  3,000 mg per day    ° ° °Day 2   ° °Time  °Name of Medication  Number of pills °taken  °Amount of Acetaminophen  °Pain Level  ° °Comments  °AM PM       °AM PM       °AM PM       °AM PM       °AM PM       °AM PM       °AM PM       °AM PM       °Total Daily amount of Acetaminophen °Do not take more than  3,000 mg per day    ° ° °Day 3   ° °Time  °Name of Medication Number of pills taken  °Amount of Acetaminophen  °Pain Level  ° °Comments  °AM PM       °AM PM       °AM PM       °AM PM       ° ° ° °AM PM       °AM PM       °AM PM       °AM PM       °Total Daily amount of Acetaminophen °Do not take more than  3,000 mg per day    ° ° °Day 4   ° °Time  °Name of Medication Number of pills taken  °Amount of Acetaminophen  °Pain Level  ° °Comments  °AM PM       °AM PM       °AM PM       °AM PM       °AM PM       °AM PM       °AM PM       °AM PM       °Total Daily amount of Acetaminophen °Do not take more than  3,000 mg per day    ° ° °Day 5   ° °Time  °Name of Medication Number °of pills taken  °Amount of Acetaminophen  °Pain Level  ° °Comments  °AM PM       °AM PM       °AM PM       °AM PM       °AM PM       °AM   PM       °AM PM       °AM PM       °Total Daily amount of Acetaminophen °Do not take more than  3,000 mg per day    ° ° ° °Day 6   ° °Time  °Name of Medication Number of pills °taken  °Amount of Acetaminophen  °Pain Level  °Comments  °AM PM       °AM PM       °AM PM       °AM PM       °AM PM       °AM PM       °AM PM       °AM PM       °Total Daily amount of Acetaminophen °Do not take more than  3,000 mg per day    ° ° °Day 7   ° °Time  °Name of Medication Number of pills taken  °Amount of Acetaminophen  °Pain Level  ° °Comments  °AM PM       °AM PM       °AM PM       °AM PM       °AM PM       °AM PM       °AM PM       °AM PM       °Total Daily amount of Acetaminophen °Do not take more than  3,000 mg per day    ° ° ° ° °For additional information about how and where to safely dispose of unused opioid °medications - https://www.morepowerfulnc.org ° °Disclaimer: This document  contains information and/or instructional materials adapted from [facility] for the typical patient with your condition. It does not replace medical advice from your health care provider because your experience may differ from that of the °typical patient. Talk to your health care provider if you have any questions about this °document, your condition or your treatment plan. °Adapted from [facility] ° °

## 2022-02-02 NOTE — ED Notes (Signed)
This RN called CT about pt's scan. CT staff stated they are coming to get him shortly.
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
## 2022-02-02 NOTE — Op Note (Signed)
Laparoscopic Cholecystectomy Procedure Note  Indications: This patient presents with symptomatic gallbladder disease and will undergo laparoscopic cholecystectomy.  Pre-operative Diagnosis: acute cholecystitis with gallstones  Post-operative Diagnosis: Same  Surgeon: Coralie Keens   Assistants: 0  Anesthesia: General endotracheal anesthesia  ASA Class: 1  Procedure Details  The patient was seen again in the Holding Room. The risks, benefits, complications, treatment options, and expected outcomes were discussed with the patient. The possibilities of reaction to medication, pulmonary aspiration, perforation of viscus, bleeding, recurrent infection, finding a normal gallbladder, the need for additional procedures, failure to diagnose a condition, the possible need to convert to an open procedure, and creating a complication requiring transfusion or operation were discussed with the patient. The likelihood of improving the patient's symptoms with return to their baseline status is good.  The patient and/or family concurred with the proposed plan, giving informed consent. The site of surgery properly noted. The patient was taken to Operating Room, identified as Timothy Crosby and the procedure verified as Laparoscopic Cholecystectomy with Intraoperative Cholangiogram. A Time Out was held and the above information confirmed.  Prior to the induction of general anesthesia, antibiotic prophylaxis was administered. General endotracheal anesthesia was then administered and tolerated well. After the induction, the abdomen was prepped with Chloraprep and draped in sterile fashion. The patient was positioned in the supine position.  Local anesthetic agent was injected into the skin near the umbilicus and an incision made. We dissected down to the abdominal fascia with blunt dissection.  The fascia was incised vertically and we entered the peritoneal cavity bluntly.  A pursestring suture of 0-Vicryl was  placed around the fascial opening.  The Hasson cannula was inserted and secured with the stay suture.  Pneumoperitoneum was then created with CO2 and tolerated well without any adverse changes in the patient's vital signs. A 5-mm port was placed in the subxiphoid position.  Two 5-mm ports were placed in the right upper quadrant. All skin incisions were infiltrated with a local anesthetic agent before making the incision and placing the trocars.   We positioned the patient in reverse Trendelenburg, tilted slightly to the patient's left.  The gallbladder was identified, the fundus grasped and retracted cephalad. Adhesions were lysed bluntly and with the electrocautery where indicated, taking care not to injure any adjacent organs or viscus. The infundibulum was grasped and retracted laterally, exposing the peritoneum overlying the triangle of Calot. This was then divided and exposed in a blunt fashion. The cystic duct was clearly identified and bluntly dissected circumferentially. A critical view of the cystic duct and cystic artery was obtained.  The cystic duct was then ligated with clips and divided. The cystic artery was, dissected free, ligated with clips and divided as well.   The gallbladder was dissected from the liver bed in retrograde fashion with the electrocautery. The gallbladder was removed and placed in an Endocatch sac. The liver bed was irrigated and inspected. Hemostasis was achieved with the electrocautery. Copious irrigation was utilized and was repeatedly aspirated until clear.  The gallbladder and Endocatch sac were then removed through the umbilical port site.  The pursestring suture was used to close the umbilical fascia.    We again inspected the right upper quadrant for hemostasis.  Pneumoperitoneum was released as we removed the trocars.  4-0 Monocryl was used to close the skin.   Skin glue was then applied. The patient was then extubated and brought to the recovery room in stable  condition. Instrument, sponge, and needle  counts were correct at closure and at the conclusion of the case.   Findings: Acute Cholecystitis with Cholelithiasis  Estimated Blood Loss: Minimal         Drains: 0         Specimens: Gallbladder           Complications: None; patient tolerated the procedure well.         Disposition: PACU - hemodynamically stable.         Condition: stable

## 2022-02-02 NOTE — Transfer of Care (Signed)
Immediate Anesthesia Transfer of Care Note  Patient: Timothy Crosby  Procedure(s) Performed: LAPAROSCOPIC CHOLECYSTECTOMY (Abdomen)  Patient Location: PACU  Anesthesia Type:General  Level of Consciousness: awake, alert  and oriented  Airway & Oxygen Therapy: Patient Spontanous Breathing and Patient connected to face mask oxygen  Post-op Assessment: Report given to RN, Post -op Vital signs reviewed and stable and Patient moving all extremities X 4  Post vital signs: Reviewed and stable  Last Vitals:  Vitals Value Taken Time  BP 112/89 02/02/22 1608  Temp 36.4 C 02/02/22 1608  Pulse 100 02/02/22 1610  Resp 12 02/02/22 1610  SpO2 95 % 02/02/22 1610  Vitals shown include unvalidated device data.  Last Pain:  Vitals:   02/02/22 1409  TempSrc:   PainSc: 3          Complications: No notable events documented.

## 2022-02-02 NOTE — Anesthesia Postprocedure Evaluation (Signed)
Anesthesia Post Note  Patient: Timothy Crosby  Procedure(s) Performed: LAPAROSCOPIC CHOLECYSTECTOMY (Abdomen)     Patient location during evaluation: PACU Anesthesia Type: General Level of consciousness: awake and alert Pain management: pain level controlled Vital Signs Assessment: post-procedure vital signs reviewed and stable Respiratory status: spontaneous breathing, nonlabored ventilation, respiratory function stable and patient connected to nasal cannula oxygen Cardiovascular status: blood pressure returned to baseline and stable Postop Assessment: no apparent nausea or vomiting Anesthetic complications: no   No notable events documented.  Last Vitals:  Vitals:   02/02/22 1638 02/02/22 1645  BP: 118/85 110/73  Pulse: 63 69  Resp: 15 17  Temp:  36.7 C  SpO2: 97% 98%    Last Pain:  Vitals:   02/02/22 1638  TempSrc:   PainSc: 4                  Tiajuana Amass

## 2022-02-02 NOTE — ED Provider Notes (Signed)
[facility] EMERGENCY DEPARTMENT Provider Note   CSN: [id number] Arrival date & time: 02/02/22  0155     History  Chief Complaint  Patient presents with   Abdominal Pain    Aidyn Kellis Tangeman is a 41 y.o. male.  41 year old male presents to the emergency department for evaluation of abdominal pain which began around 2200 tonight.  Symptoms started shortly after lying down to go to bed.  He describes a constant, waxing and waning, gripping pain in his right abdomen and up into his lower sternum.  He took 400 mg ibuprofen prior to arrival without relief.  Reports emesis x2 secondary to severe pain.  Does not have any persistent nausea.  Did have a bowel movement prior to bed tonight.  No fevers, chills, prior abdominal surgeries.   Abdominal Pain      Home Medications Prior to Admission medications   Medication Sig Start Date End Date Taking? Authorizing Provider  naproxen sodium (ALEVE) 220 MG tablet Take 440 mg by mouth once as needed (for pain).   Yes [provider]      Allergies    Divalproex sodium, Primaquine, and Primaquine phosphate    Review of Systems   Review of Systems  Gastrointestinal:  Positive for abdominal pain.  Ten systems reviewed and are negative for acute change, except as noted in the HPI.    Physical Exam Updated Vital Signs BP 113/82   Pulse 61   Temp 98.3 F (36.8 C) (Oral)   Resp 16   Ht 5\' 5"  (1.651 m)   Wt 31.1 kg   SpO2 97%   BMI 11.41 kg/m   Physical Exam Vitals and nursing note reviewed.  Constitutional:      General: He is not in acute distress.    Appearance: He is well-developed. He is not diaphoretic.     Comments: Appears uncomfortable.  Intermittently wincing in pain.  Nontoxic.  HENT:     Head: Normocephalic and atraumatic.  Eyes:     General: No scleral icterus.    Conjunctiva/sclera: Conjunctivae normal.  Cardiovascular:     Rate and Rhythm: Normal rate and regular rhythm.     Pulses:  Normal pulses.  Pulmonary:     Effort: Pulmonary effort is normal. No respiratory distress.     Breath sounds: No stridor. No wheezing.     Comments: Lungs clear to auscultation bilaterally.  Respirations even and unlabored. Abdominal:     Palpations: There is no mass.     Tenderness: There is abdominal tenderness.     Comments: Hypoactive BS. Diffuse tenderness to palpation, worse in the right upper and lower quadrants.  No abdominal distention or palpable masses.  Musculoskeletal:        General: Normal range of motion.     Cervical back: Normal range of motion.  Skin:    General: Skin is warm and dry.     Coloration: Skin is not pale.     Findings: No erythema or rash.  Neurological:     Mental Status: He is alert and oriented to person, place, and time.     Coordination: Coordination normal.  Psychiatric:        Behavior: Behavior normal.     ED Results / Procedures / Treatments   Labs (all labs ordered are listed, but only abnormal results are displayed) Labs Reviewed  COMPREHENSIVE METABOLIC PANEL - Abnormal; Notable for the following components:      Result Value   Glucose,  Bld 123 (*)    Creatinine, Ser 1.32 (*)    All other components within normal limits  CBC WITH DIFFERENTIAL/PLATELET - Abnormal; Notable for the following components:   Neutro Abs 8.1 (*)    All other components within normal limits  URINALYSIS, ROUTINE W REFLEX MICROSCOPIC - Abnormal; Notable for the following components:   APPearance HAZY (*)    All other components within normal limits  LIPASE, BLOOD    EKG None  Radiology No results found.  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 Sudden onset constant but waxing / waning at around 10pm last night. Had been feeling some vague stomach ache in previous days. Has had some isolated emesis 2/2 pain. Hx of hernia repair, otherwise no abdominal surgeries. Generalized tenderness, epigastric / right sided. Labs are overall remarkable other than creat bump -- CT scan + dispo. Drank contrast so slightly delayed CT scan. [CP]    Clinical Course User Index [CP] Olene Floss, PA-C                           Medical Decision Making Amount and/or Complexity of Data Reviewed Labs: ordered. Radiology: ordered.  Risk Prescription drug management.   This patient presents to the ED for concern of abdominal pain, this involves an extensive number of treatment options, and is a complaint that carries with it a high risk of complications and morbidity.  The differential diagnosis includes ruptured viscous vs kidney stone vs biliary colic vs appendicitis vs pSBO/SBO vs colitis   Co morbidities that complicate the patient evaluation  None    Lab Tests:  I Ordered, and personally interpreted labs.  The pertinent results include:  Creatinine of 1.32 (up from 1.12 in 2017). Otherwise normal CBC, CMP, lipase, UA.   Imaging Studies ordered:  I ordered imaging studies including CT abdomen/pelvis  Imaging results pending   Cardiac Monitoring:  The patient was maintained on a cardiac monitor.  I personally viewed and interpreted the cardiac monitored which showed an underlying rhythm of: NSR   Medicines ordered and prescription drug management:  I ordered medication including Bentyl for abdominal pain  I have reviewed the patients home medicines and have made adjustments as needed   Problem List / ED Course:  As above   Social Determinants of Health:  Insured patient    Dispostion:  Patient care signed  out to Prosperi, PA-C at shift change. Dispo per CT results.         Final Clinical Impression(s) / ED Diagnoses Final diagnoses:  Abdominal pain, unspecified abdominal location    Rx / DC Orders ED Discharge Orders     None         Antony Madura, PA-C 02/02/22 9983    Dione Booze, MD 02/02/22 2243

## 2022-02-03 ENCOUNTER — Encounter (HOSPITAL_COMMUNITY): Payer: Self-pay | Admitting: Surgery

## 2022-02-04 LAB — SURGICAL PATHOLOGY

## 2024-06-12 ENCOUNTER — Other Ambulatory Visit: Payer: Self-pay

## 2024-06-12 ENCOUNTER — Emergency Department: Admission: EM | Admit: 2024-06-12 | Discharge: 2024-06-12 | Disposition: A | Discharge status: Home / Self Care

## 2024-06-12 ENCOUNTER — Emergency Department

## 2024-06-12 DIAGNOSIS — J45909 Unspecified asthma, uncomplicated: Secondary | ICD-10-CM | POA: Insufficient documentation

## 2024-06-12 DIAGNOSIS — N135 Crossing vessel and stricture of ureter without hydronephrosis: Secondary | ICD-10-CM | POA: Diagnosis not present

## 2024-06-12 DIAGNOSIS — R1031 Right lower quadrant pain: Secondary | ICD-10-CM | POA: Diagnosis present

## 2024-06-12 LAB — COMPREHENSIVE METABOLIC PANEL WITH GFR
ALT: 51 U/L — ABNORMAL HIGH (ref 0–44)
AST: 39 U/L (ref 15–41)
Albumin: 4.6 g/dL (ref 3.5–5.0)
Alkaline Phosphatase: 67 U/L (ref 38–126)
Anion gap: 10 (ref 5–15)
BUN: 10 mg/dL (ref 6–20)
CO2: 27 mmol/L (ref 22–32)
Calcium: 9.3 mg/dL (ref 8.9–10.3)
Chloride: 104 mmol/L (ref 98–111)
Creatinine, Ser: 1.31 mg/dL — ABNORMAL HIGH (ref 0.61–1.24)
GFR, Estimated: >60 mL/min
Glucose, Bld: 100 mg/dL — ABNORMAL HIGH (ref 70–99)
Potassium: 4.1 mmol/L (ref 3.5–5.1)
Sodium: 140 mmol/L (ref 135–145)
Total Bilirubin: 0.6 mg/dL (ref 0.0–1.2)
Total Protein: 7.6 g/dL (ref 6.5–8.1)

## 2024-06-12 LAB — URINALYSIS, ROUTINE W REFLEX MICROSCOPIC
Bilirubin Urine: NEGATIVE
Glucose, UA: NEGATIVE mg/dL
Ketones, ur: NEGATIVE mg/dL
Nitrite: NEGATIVE
Protein, ur: 100 mg/dL — AB
Specific Gravity, Urine: 1.027 (ref 1.005–1.030)
pH: 5 (ref 5.0–8.0)

## 2024-06-12 LAB — CBC
HCT: 43.8 % (ref 39.0–52.0)
Hemoglobin: 15.5 g/dL (ref 13.0–17.0)
MCH: 29 pg (ref 26.0–34.0)
MCHC: 35.4 g/dL (ref 30.0–36.0)
MCV: 82 fL (ref 80.0–100.0)
Platelets: 283 10*3/uL (ref 150–400)
RBC: 5.34 MIL/uL (ref 4.22–5.81)
RDW: 12.3 % (ref 11.5–15.5)
WBC: 11.2 10*3/uL — ABNORMAL HIGH (ref 4.0–10.5)
nRBC: 0 % (ref 0.0–0.2)

## 2024-06-12 LAB — LIPASE, BLOOD: Lipase: 70 U/L — ABNORMAL HIGH (ref 11–51)

## 2024-06-12 MED ORDER — ONDANSETRON 4 MG PO TBDP: 4.0000 mg | ORAL_TABLET | Freq: Three times a day (TID) | ORAL | 0 refills | Status: AC | PRN

## 2024-06-12 MED ORDER — SODIUM CHLORIDE 0.9 % IV BOLUS
1000.0000 mL | Freq: Once | INTRAVENOUS | Status: AC
  Administered 2024-06-12: 1000 mL via INTRAVENOUS

## 2024-06-12 MED ORDER — TAMSULOSIN HCL 0.4 MG PO CAPS
0.4000 mg | ORAL_CAPSULE | Freq: Once | ORAL | Status: AC
  Administered 2024-06-12: 0.4 mg via ORAL
  Filled 2024-06-12: qty 1

## 2024-06-12 MED ORDER — SULFAMETHOXAZOLE-TRIMETHOPRIM 800-160 MG PO TABS
1.0000 | ORAL_TABLET | Freq: Once | ORAL | Status: AC
  Administered 2024-06-12: 1 via ORAL
  Filled 2024-06-12: qty 1

## 2024-06-12 MED ORDER — KETOROLAC TROMETHAMINE 30 MG/ML IJ SOLN
30.0000 mg | Freq: Once | INTRAMUSCULAR | Status: AC
  Administered 2024-06-12: 30 mg via INTRAVENOUS
  Filled 2024-06-12: qty 1

## 2024-06-12 MED ORDER — ONDANSETRON HCL 4 MG/2ML IJ SOLN
4.0000 mg | Freq: Once | INTRAMUSCULAR | Status: AC
  Administered 2024-06-12: 4 mg via INTRAVENOUS
  Filled 2024-06-12: qty 2

## 2024-06-12 MED ORDER — OXYCODONE-ACETAMINOPHEN 5-325 MG PO TABS: 1.0000 | ORAL_TABLET | Freq: Three times a day (TID) | ORAL | 0 refills | Status: AC | PRN

## 2024-06-12 MED ORDER — IOHEXOL 300 MG/ML  SOLN
100.0000 mL | Freq: Once | INTRAMUSCULAR | Status: AC | PRN
  Administered 2024-06-12: 100 mL via INTRAVENOUS

## 2024-06-12 MED ORDER — MORPHINE SULFATE (PF) 2 MG/ML IV SOLN
2.0000 mg | Freq: Once | INTRAVENOUS | Status: AC
  Administered 2024-06-12: 2 mg via INTRAVENOUS
  Filled 2024-06-12: qty 1

## 2024-06-12 MED ORDER — SULFAMETHOXAZOLE-TRIMETHOPRIM 800-160 MG PO TABS: 1.0000 | ORAL_TABLET | Freq: Two times a day (BID) | ORAL | 0 refills | Status: AC

## 2024-06-12 MED ORDER — TAMSULOSIN HCL 0.4 MG PO CAPS: 0.4000 mg | ORAL_CAPSULE | Freq: Every day | ORAL | 0 refills | Status: AC

## 2024-06-12 NOTE — Discharge Instructions (Addendum)
 You have been seen in the Emergency Department (ED) today for pain caused by kidney stones.  As we have discussed, please drink plenty of fluids.  Please make a follow up appointment with the physician(s) listed elsewhere in this documentation, Dr. Georganne (urology) as soon as possible.  You may take pain medication as needed but ONLY as prescribed.  Please also take your prescribed Flomax  daily.    Please see your doctor as soon as possible as stones may take 1-3 weeks to pass and you may require additional care or medications.  Do not drink alcohol, drive or participate in any other potentially dangerous activities while taking opiate pain medication as it may make you sleepy. Do not take this medication with any other sedating medications, either prescription or over-the-counter. If you were prescribed Percocet or Vicodin, do not take these with acetaminophen  (Tylenol ) as it is already contained within these medications. Take Ibuprofen  based on the instructions on the bottle.   Take Percocet as needed for severe pain.  This medication is an opiate (or narcotic) pain medication and can be habit forming.  Use it as little as possible to achieve adequate pain control.  Do not use or use it with extreme caution if you have a history of opiate abuse or dependence.  If you are on a pain contract with your primary care doctor or a pain specialist, be sure to let them know you were prescribed this medication today from the [facility] Emergency Department.  This medication is intended for your use only - do not give any to anyone else and keep it in a secure place where nobody else, especially children, have access to it.  It will also cause or worsen constipation, so you may want to consider taking an over-the-counter stool softener while you are taking this medication.  Return to the Emergency Department (ED) or call your doctor if you have any worsening pain, fever, painful urination, are unable to  urinate, vomiting, or develop other symptoms that concern you.

## 2024-06-12 NOTE — ED Provider Notes (Addendum)
 "  [facility] Provider Note    Event Date/Time   First MD Initiated Contact with Patient 06/12/24 1515     (approximate)   History   Abdominal Pain   HPI  Timothy Crosby is a 44 y.o. male  with a past medical history of asthma, seizure disorder, basilar skull fracture, hernia repair, cholecystectomy in 2023 presents to the emergency department with right lower quadrant abdominal pain radiating into his right lower back and right groin that started around 12 pm today.  Patient states the pain came on abruptly in his umbilical region and progressed to his right abdomen while he was in the grocery store and has been the same since. He also endorses a couple episodes of nonbloody vomiting that occurred after the pain started.  Patient reports he does have a history of kidney stones.  He denies any urinary symptoms, testicular or penile pain, abnormal penile discharge, chest pain or shortness of breath, diarrhea, fevers, sore throat. He has taken some ibuprofen  prior to arrival with minimal improvement in pain.  Patient has not eaten any food today, but did have some juice around 1:30 PM.  Physical Exam   Triage Vital Signs: ED Triage Vitals  Encounter Vitals Group     BP 06/12/24 1409 125/86     Girls Systolic BP Percentile --      Girls Diastolic BP Percentile --      Boys Systolic BP Percentile --      Boys Diastolic BP Percentile --      Pulse Rate 06/12/24 1409 (!) 55     Resp 06/12/24 1409 16     Temp 06/12/24 1409 98.3 F (36.8 C)     Temp src --      SpO2 06/12/24 1409 98 %     Weight 06/12/24 1412 155 lb (70.3 kg)     Height 06/12/24 1412 5' 5 (1.651 m)     Head Circumference --      Peak Flow --      Pain Score 06/12/24 1411 7     Pain Loc --      Pain Education --      Exclude from Growth Chart --     Most recent vital signs: Vitals:   06/12/24 1409  BP: 125/86  Pulse: (!) 55  Resp: 16  Temp: 98.3 F (36.8 C)  SpO2: 98%    General:  Awake, in no acute distress. Appears stated age. CV: Good peripheral perfusion. HR 55 bpm. Normal rhythm. Respiratory:Normal respiratory effort.  No respiratory distress. CTAB. GI: Soft, non-distended, TTP in RLQ and right flank, right groin. Skin:Warm, dry, intact. No abdominal or back rash. GU: Chaperone RN present for exam.  Testicular and penile exam without any swelling, erythema, tenderness.  Normal cremasteric reflex bilaterally.  ED Results / Procedures / Treatments   Labs (all labs ordered are listed, but only abnormal results are displayed) Labs Reviewed  LIPASE, BLOOD - Abnormal; Notable for the following components:      Result Value   Lipase 70 (*)    All other components within normal limits  COMPREHENSIVE METABOLIC PANEL WITH GFR - Abnormal; Notable for the following components:   Glucose, Bld 100 (*)    Creatinine, Ser 1.31 (*)    ALT 51 (*)    All other components within normal limits  CBC - Abnormal; Notable for the following components:   WBC 11.2 (*)    All other components within normal  limits  URINALYSIS, ROUTINE W REFLEX MICROSCOPIC - Abnormal; Notable for the following components:   Color, Urine AMBER (*)    APPearance CLOUDY (*)    Hgb urine dipstick MODERATE (*)    Protein, ur 100 (*)    Leukocytes,Ua TRACE (*)    Bacteria, UA RARE (*)    All other components within normal limits     EKG     RADIOLOGY CT abdomen pelvis   FINDINGS:   LOWER CHEST: Tiny hiatal hernia.   LIVER: The liver is unremarkable.   GALLBLADDER AND BILE DUCTS: Status post cholecystectomy. No biliary ductal dilatation.   SPLEEN: No acute abnormality.   PANCREAS: No acute abnormality.   ADRENAL GLANDS: No acute abnormality.   KIDNEYS, URETERS AND BLADDER: 6 mm calcified stone at the right ureteropelvic junction with associated urothelial thickening and mild hydronephrosis. Mild fullness of the right ureter. Fluid density lesion of the right kidney likely  represents a simple renal cyst. Simple renal cysts do not require additional follow-up unless clinically indicated due to signs/symptoms. No left hydronephrosis. No left nephroureterolithiasis. No perinephric or periureteral stranding. Urinary bladder is unremarkable. The prostate is unremarkable.   GI AND BOWEL: Stomach demonstrates no acute abnormality. No small or large bowel thickening or dilatation. The appendix is unremarkable. Colonic diverticulosis. There is no bowel obstruction.   PERITONEUM AND RETROPERITONEUM: No ascites. No free air.   VASCULATURE: Aorta is normal in caliber.   LYMPH NODES: No lymphadenopathy.   REPRODUCTIVE ORGANS: The prostate is unremarkable.   BONES AND SOFT TISSUES: No acute osseous abnormality. No focal soft tissue abnormality.   IMPRESSION: 1. Obstructive 6 mm calcified stone at the right ureteropelvic junction with associated urothelial thickening concerning for superimposed infection. Recommend urinalysis correlation. 2. Tiny hiatal hernia.   PROCEDURES:  Critical Care performed: No   Procedures   MEDICATIONS ORDERED IN ED: Medications  sulfamethoxazole -trimethoprim  (BACTRIM  DS) 800-160 MG per tablet 1 tablet (has no administration in time range)  tamsulosin  (FLOMAX ) capsule 0.4 mg (has no administration in time range)  morphine  (PF) 2 MG/ML injection 2 mg (has no administration in time range)  sodium chloride  0.9 % bolus 1,000 mL (1,000 mLs Intravenous New Bag/Given 06/12/24 1616)  ondansetron  (ZOFRAN ) injection 4 mg (4 mg Intravenous Given 06/12/24 1616)  ketorolac  (TORADOL ) 30 MG/ML injection 30 mg (30 mg Intravenous Given 06/12/24 1616)  iohexol  (OMNIPAQUE ) 300 MG/ML solution 100 mL (100 mLs Intravenous Contrast Given 06/12/24 1702)     IMPRESSION / MDM / ASSESSMENT AND PLAN / ED COURSE  I reviewed the triage vital signs and the nursing notes.                              Differential diagnosis includes, but is not limited  to, urolithiasis, appendicitis, renal cyst, UTI, dehydration  Patient's presentation is most consistent with acute complicated illness / injury requiring diagnostic workup.   Clinical Course as of 06/12/24 1905  Timothy Crosby Jun 12, 2024  1541 Patient is a 44 year old male presenting with right lower quadrant pain that originally started in his umbilical region and is radiating to his right flank and groin that started abruptly today while at the grocery store.  He is well-appearing on exam.  Heart rates 55 bpm, afebrile.  Not vomiting in the room but did endorse some episodes of vomiting today.  Labs ordered in triage.  CBC shows mild white blood cell count elevation 11.2.  CMP with  creatinine of 1.31, elevated ALT at 51, elevated lipase at 70.  Last Last Cr obtained 2 years ago was 1.32. UA shows moderate hemoglobin, proteinuria 100, trace leukocytes, WBC 6-10, 21-50 RBC, and rare bacteria.  He is not having any urinary symptoms or right upper quadrant pain at this time.  Negative Murphy sign on exam.  Will order CT abdomen pelvis, give fluids, Toradol  and Zofran  and reassess after imaging back.    [SD]  1728 CT Abdomen Pelvis IMPRESSION: 1. Obstructive 6 mm calcified stone at the right ureteropelvic junction with associated urothelial thickening concerning for superimposed infection. Recommend urinalysis correlation. 2. Tiny hiatal hernia.  Planning for urology consult. Discussed case with supervising physician, Dr. Ozell Klein. [SD]  [phone] Spoke with urology, Dr. Georganne.  He states the patient can follow-up with him outpatient in office.  Also advised to start patient on empiric antibiotics, suggested Bactrim .  Will give Flomax , Zofran , Bactrim , few Percocet to use for breakthrough pain.  Patient already has ibuprofen  at home. PDMP reviewed prior to prescribing Percocet. Will provide strainer. Also discussed increased fluid intake at home.  Will place referral to urology outpatient. Discussed ED return  precautions with him and wife. [SD]    Clinical Course User Index [SD] Sheron Salm, PA-C   The patient may return to the emergency department for any new, worsening, or concerning symptoms. Patient was given the opportunity to ask questions; all questions were answered. Emergency department return precautions were discussed with the patient.  Patient is in agreement to the treatment plan.  Patient is stable for discharge.   FINAL CLINICAL IMPRESSION(S) / ED DIAGNOSES   Final diagnoses:  Ureteropelvic junction (UPJ) obstruction, right     Rx / DC Orders   ED Discharge Orders          Ordered    oxyCODONE -acetaminophen  (PERCOCET) 5-325 MG tablet  Every 8 hours PRN        06/12/24 1822    tamsulosin  (FLOMAX ) 0.4 MG CAPS capsule  Daily after supper        06/12/24 1822    sulfamethoxazole -trimethoprim  (BACTRIM  DS) 800-160 MG tablet  2 times daily        06/12/24 1822    ondansetron  (ZOFRAN -ODT) 4 MG disintegrating tablet  Every 8 hours PRN        06/12/24 1902             Note:  This document was prepared using Dragon voice recognition software and may include unintentional dictation errors.     Sheron Salm, PA-C 06/12/24 1824    [address] [place], PA-C 06/12/24 1905    Klein Ozell LABOR, MD 06/12/24 2021  "

## 2024-06-12 NOTE — ED Triage Notes (Signed)
 Pt comes in via pov with complaints of abdominal pain that started about 2 hours ago. Pt complains of right sided abdominal pain that radiates to is right groin, and around to the right side of his back. Pt has a history of kidney stones. Pt states that he had his gallbladder removed 4 years ago. Pt complains of pain 7/10 at this time.

## 2024-06-16 ENCOUNTER — Encounter: Payer: Self-pay | Admitting: Urology

## 2024-06-16 ENCOUNTER — Ambulatory Visit
Admission: RE | Admit: 2024-06-16 | Discharge: 2024-06-16 | Disposition: A | Source: Ambulatory Visit | Attending: Urology | Admitting: Urology

## 2024-06-16 ENCOUNTER — Ambulatory Visit: Admitting: Urology

## 2024-06-16 VITALS — BP 118/80 | HR 70 | Ht 65.0 in | Wt 145.0 lb

## 2024-06-16 DIAGNOSIS — N201 Calculus of ureter: Secondary | ICD-10-CM

## 2024-06-16 DIAGNOSIS — N200 Calculus of kidney: Secondary | ICD-10-CM

## 2024-06-16 DIAGNOSIS — N23 Unspecified renal colic: Secondary | ICD-10-CM

## 2024-06-16 DIAGNOSIS — N132 Hydronephrosis with renal and ureteral calculous obstruction: Secondary | ICD-10-CM

## 2024-06-16 LAB — MICROSCOPIC EXAMINATION

## 2024-06-16 LAB — URINALYSIS, COMPLETE
Bilirubin, UA: NEGATIVE
Glucose, UA: NEGATIVE
Ketones, UA: NEGATIVE
Leukocytes,UA: NEGATIVE
Nitrite, UA: NEGATIVE
Protein,UA: NEGATIVE
Specific Gravity, UA: 1.015 (ref 1.005–1.030)
Urobilinogen, Ur: 0.2 mg/dL (ref 0.2–1.0)
pH, UA: 5 (ref 5.0–7.5)

## 2024-06-16 MED ORDER — OXYCODONE HCL 5 MG PO TABS: 5.0000 mg | ORAL_TABLET | ORAL | 0 refills | Status: AC | PRN
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
## 2024-06-17 ENCOUNTER — Encounter: Payer: Self-pay | Admitting: Urology

## 2024-06-19 ENCOUNTER — Other Ambulatory Visit: Payer: Self-pay

## 2024-06-19 DIAGNOSIS — N201 Calculus of ureter: Secondary | ICD-10-CM

## 2024-06-19 MED ORDER — TAMSULOSIN HCL 0.4 MG PO CAPS: 0.4000 mg | ORAL_CAPSULE | Freq: Every day | ORAL | 0 refills | Status: AC

## 2024-06-29 ENCOUNTER — Ambulatory Visit: Admission: RE | Admit: 2024-06-29 | Admitting: Urology

## 2024-06-29 ENCOUNTER — Encounter: Admission: RE | Payer: Self-pay
